# Patient Record
Sex: Male | Born: 1950
Health system: Southern US, Community
[De-identification: ages and names within clinical notes are randomized; demographics above are authoritative.]

## PROBLEM LIST (undated history)

## (undated) DIAGNOSIS — F988 Other specified behavioral and emotional disorders with onset usually occurring in childhood and adolescence: Secondary | ICD-10-CM

## (undated) DIAGNOSIS — Z91199 Patient's noncompliance with other medical treatment and regimen due to unspecified reason: Secondary | ICD-10-CM

## (undated) DIAGNOSIS — I10 Essential (primary) hypertension: Secondary | ICD-10-CM

## (undated) DIAGNOSIS — N529 Male erectile dysfunction, unspecified: Secondary | ICD-10-CM

## (undated) DIAGNOSIS — Z9119 Patient's noncompliance with other medical treatment and regimen: Secondary | ICD-10-CM

## (undated) DIAGNOSIS — IMO0002 Reserved for concepts with insufficient information to code with codable children: Secondary | ICD-10-CM

## (undated) DIAGNOSIS — E785 Hyperlipidemia, unspecified: Secondary | ICD-10-CM

## (undated) DIAGNOSIS — E1165 Type 2 diabetes mellitus with hyperglycemia: Secondary | ICD-10-CM

## (undated) DIAGNOSIS — B019 Varicella without complication: Secondary | ICD-10-CM

## (undated) HISTORY — DX: Other specified behavioral and emotional disorders with onset usually occurring in childhood and adolescence: F98.8

## (undated) HISTORY — DX: Male erectile dysfunction, unspecified: N52.9

## (undated) HISTORY — DX: Type 2 diabetes mellitus with hyperglycemia: E11.65

## (undated) HISTORY — DX: Patient's noncompliance with other medical treatment and regimen due to unspecified reason: Z91.199

## (undated) HISTORY — DX: Essential (primary) hypertension: I10

## (undated) HISTORY — DX: Hyperlipidemia, unspecified: E78.5

## (undated) HISTORY — DX: Reserved for concepts with insufficient information to code with codable children: IMO0002

## (undated) HISTORY — DX: Varicella without complication: B01.9

## (undated) HISTORY — PX: PENILE PROSTHESIS IMPLANT: SHX240

## (undated) HISTORY — PX: WISDOM TOOTH EXTRACTION: SHX21

## (undated) HISTORY — DX: Patient's noncompliance with other medical treatment and regimen: Z91.19

---

## 2002-05-30 ENCOUNTER — Encounter: Admission: RE | Admit: 2002-05-30 | Discharge: 2002-08-28 | Payer: Self-pay | Admitting: Family Medicine

## 2007-11-08 ENCOUNTER — Ambulatory Visit: Payer: Self-pay | Admitting: Internal Medicine

## 2007-11-08 DIAGNOSIS — E785 Hyperlipidemia, unspecified: Secondary | ICD-10-CM | POA: Insufficient documentation

## 2007-11-08 DIAGNOSIS — I10 Essential (primary) hypertension: Secondary | ICD-10-CM | POA: Insufficient documentation

## 2007-11-08 DIAGNOSIS — F528 Other sexual dysfunction not due to a substance or known physiological condition: Secondary | ICD-10-CM

## 2008-01-11 ENCOUNTER — Telehealth: Payer: Self-pay | Admitting: Internal Medicine

## 2008-01-11 ENCOUNTER — Ambulatory Visit: Payer: Self-pay | Admitting: Internal Medicine

## 2008-01-11 DIAGNOSIS — B351 Tinea unguium: Secondary | ICD-10-CM

## 2008-01-11 LAB — CONVERTED CEMR LAB
ALT: 20 units/L (ref 0–53)
AST: 16 units/L (ref 0–37)
Albumin: 4.3 g/dL (ref 3.5–5.2)
BUN: 17 mg/dL (ref 6–23)
Basophils Absolute: 0.1 10*3/uL (ref 0.0–0.1)
Basophils Relative: 1 % (ref 0–1)
Calcium: 9.7 mg/dL (ref 8.4–10.5)
Cholesterol: 274 mg/dL (ref 0–200)
Creatinine, Ser: 0.7 mg/dL (ref 0.4–1.5)
Creatinine,U: 79.7 mg/dL
Direct LDL: 117.9 mg/dL
Eosinophils Absolute: 0.3 10*3/uL (ref 0.0–0.7)
Eosinophils Relative: 5 % (ref 0–5)
GFR calc non Af Amer: 124 mL/min
HCT: 49.5 % (ref 39.0–52.0)
Hemoglobin: 16.3 g/dL (ref 13.0–17.0)
Hgb A1c MFr Bld: 9.5 % — ABNORMAL HIGH (ref 4.6–6.0)
Lymphocytes Relative: 28 % (ref 12–46)
MCHC: 32.9 g/dL (ref 30.0–36.0)
Microalb, Ur: 1.2 mg/dL (ref 0.0–1.9)
Monocytes Absolute: 0.3 10*3/uL (ref 0.1–1.0)
Platelets: 345 10*3/uL (ref 150–400)
RDW: 13.7 % (ref 11.5–15.5)
Total Bilirubin: 0.7 mg/dL (ref 0.3–1.2)
Total CHOL/HDL Ratio: 8.5
Triglycerides: 1042 mg/dL (ref 0–149)
VLDL: 208 mg/dL — ABNORMAL HIGH (ref 0–40)

## 2008-01-12 DIAGNOSIS — E119 Type 2 diabetes mellitus without complications: Secondary | ICD-10-CM | POA: Insufficient documentation

## 2008-01-12 DIAGNOSIS — E1165 Type 2 diabetes mellitus with hyperglycemia: Secondary | ICD-10-CM

## 2008-02-07 ENCOUNTER — Encounter: Payer: Self-pay | Admitting: Internal Medicine

## 2008-09-18 ENCOUNTER — Telehealth: Payer: Self-pay | Admitting: Internal Medicine

## 2008-09-18 ENCOUNTER — Ambulatory Visit: Payer: Self-pay | Admitting: Internal Medicine

## 2008-09-18 LAB — CONVERTED CEMR LAB
ALT: 19 units/L (ref 0–53)
AST: 13 units/L (ref 0–37)
BUN: 9 mg/dL (ref 6–23)
Calcium: 8.9 mg/dL (ref 8.4–10.5)
Creatinine, Ser: 0.7 mg/dL (ref 0.4–1.5)
Direct LDL: 159.9 mg/dL
GFR calc non Af Amer: 123.53 mL/min (ref >60)
HDL: 41.6 mg/dL (ref >39.00)
Hgb A1c MFr Bld: 13.6 % — ABNORMAL HIGH (ref 4.6–6.5)

## 2008-10-18 ENCOUNTER — Ambulatory Visit: Payer: Self-pay | Admitting: Internal Medicine

## 2008-10-18 LAB — CONVERTED CEMR LAB: Blood Glucose, Fingerstick: 285

## 2009-01-07 ENCOUNTER — Ambulatory Visit: Payer: Self-pay | Admitting: Internal Medicine

## 2009-01-08 ENCOUNTER — Encounter: Payer: Self-pay | Admitting: Internal Medicine

## 2009-01-18 LAB — CONVERTED CEMR LAB
CO2: 25 meq/L (ref 19–32)
Calcium: 9.4 mg/dL (ref 8.4–10.5)
Cholesterol: 217 mg/dL — ABNORMAL HIGH (ref 0–200)
Creatinine, Ser: 0.82 mg/dL (ref 0.40–1.50)
Creatinine, Urine: 141.3 mg/dL
HCV Ab: NEGATIVE
HDL: 54 mg/dL (ref >39)
Hepatitis B Surface Ag: NEGATIVE
Hgb A1c MFr Bld: 9.5 % — ABNORMAL HIGH (ref 4.6–6.1)
Microalb Creat Ratio: 19.2 mg/g (ref 0.0–30.0)
Microalb, Ur: 2.71 mg/dL — ABNORMAL HIGH (ref 0.00–1.89)
Sodium: 141 meq/L (ref 135–145)

## 2009-02-07 ENCOUNTER — Encounter: Payer: Self-pay | Admitting: Internal Medicine

## 2009-03-11 ENCOUNTER — Ambulatory Visit: Payer: Self-pay | Admitting: Internal Medicine

## 2009-03-11 LAB — CONVERTED CEMR LAB
Calcium: 9.9 mg/dL (ref 8.4–10.5)
Chloride: 105 meq/L (ref 96–112)
Creatinine, Ser: 0.8 mg/dL (ref 0.40–1.50)
Sodium: 142 meq/L (ref 135–145)

## 2009-03-12 ENCOUNTER — Encounter: Payer: Self-pay | Admitting: Internal Medicine

## 2009-06-05 ENCOUNTER — Ambulatory Visit: Payer: Self-pay | Admitting: Internal Medicine

## 2009-06-05 DIAGNOSIS — E11319 Type 2 diabetes mellitus with unspecified diabetic retinopathy without macular edema: Secondary | ICD-10-CM

## 2009-06-05 DIAGNOSIS — E113299 Type 2 diabetes mellitus with mild nonproliferative diabetic retinopathy without macular edema, unspecified eye: Secondary | ICD-10-CM | POA: Insufficient documentation

## 2009-06-05 LAB — CONVERTED CEMR LAB
Chloride: 103 meq/L (ref 96–112)
Glucose, Bld: 228 mg/dL — ABNORMAL HIGH (ref 70–99)
Hgb A1c MFr Bld: 8.4 % — ABNORMAL HIGH (ref 4.6–6.1)
Potassium: 4.4 meq/L (ref 3.5–5.3)
Sodium: 142 meq/L (ref 135–145)

## 2009-06-06 ENCOUNTER — Encounter: Payer: Self-pay | Admitting: Internal Medicine

## 2009-09-27 ENCOUNTER — Ambulatory Visit: Payer: Self-pay | Admitting: Internal Medicine

## 2009-09-27 DIAGNOSIS — F4320 Adjustment disorder, unspecified: Secondary | ICD-10-CM | POA: Insufficient documentation

## 2009-09-27 LAB — CONVERTED CEMR LAB
Calcium: 9.3 mg/dL (ref 8.4–10.5)
Hgb A1c MFr Bld: 9.7 % — ABNORMAL HIGH (ref <5.7)
Microalb Creat Ratio: 14.7 mg/g (ref 0.0–30.0)
Microalb, Ur: 1.26 mg/dL (ref 0.00–1.89)
Sodium: 139 meq/L (ref 135–145)

## 2009-09-29 ENCOUNTER — Encounter: Payer: Self-pay | Admitting: Internal Medicine

## 2009-11-07 ENCOUNTER — Telehealth: Payer: Self-pay | Admitting: Internal Medicine

## 2009-12-06 ENCOUNTER — Telehealth: Payer: Self-pay | Admitting: Internal Medicine

## 2009-12-23 ENCOUNTER — Ambulatory Visit: Payer: Self-pay | Admitting: Internal Medicine

## 2009-12-23 LAB — CONVERTED CEMR LAB
Chloride: 102 meq/L (ref 96–112)
Potassium: 4.5 meq/L (ref 3.5–5.3)

## 2009-12-24 ENCOUNTER — Telehealth: Payer: Self-pay | Admitting: Internal Medicine

## 2010-07-01 NOTE — Progress Notes (Signed)
Summary: Metformin Refill  Phone Note Refill Request Message from:  Fax from Pharmacy on November 07, 2009 9:17 AM  Refills Requested: Medication #1:  METFORMIN HCL 500 MG XR24H-TAB one by mouth bid   Dosage confirmed as above?Dosage Confirmed   Brand Name Necessary? No   Supply Requested: 1 month   Last Refilled: 08/29/2009  Method Requested: Electronic Next Appointment Scheduled: 12/23/09 @ 8:15 Dr Artist Pais Initial call taken by: Glendell Docker CMA,  November 07, 2009 9:17 AM    Prescriptions: METFORMIN HCL 500 MG XR24H-TAB (METFORMIN HCL) one by mouth bid  #60 x 5   Entered by:   Glendell Docker CMA   Authorized by:   D. Thomos Lemons DO   Signed by:   Glendell Docker CMA on 11/07/2009   Method used:   [facility] to        [facility] [phone]* (retail)       [address]       [place], [place]  [address]       Ph: [phone]       Fax: [phone]   RxID:   [id number]

## 2010-07-01 NOTE — Assessment & Plan Note (Signed)
Summary: 3 MONTH FOLLOW UP/MHF   Vital Signs:  Patient profile:   60 year old male Height:      63.9 inches Weight:      140.75 pounds BMI:     24.32 O2 Sat:      98 % on Room air Temp:     98.6 degrees F oral Pulse rate:   82 / minute Pulse rhythm:   regular Resp:     16 per minute BP sitting:   110 / 72  (right arm) Cuff size:   regular  Vitals Entered By: Glendell Docker CMA (December 23, 2009 8:09 AM)  O2 Flow:  Room air CC: Rm 2- 3 Month Follow up disease management Is Patient Diabetic? Yes Did you bring your meter with you today? No Pain Assessment Patient in pain? no       Does patient need assistance? Functional Status Self care Ambulation Normal Comments blood sugar avg 220, elevation is mainly at night   History of Present Illness:  Type 2 diabetes mellitus follow-up      This is a 60 year old man who presents with Type 2 diabetes mellitus follow-up.  The patient denies self managed hypoglycemia and hypoglycemia requiring help.  Since the last visit the patient reports poor dietary compliance and not monitoring blood glucose.   He reports eating less during the summer eating out less not using novolog consistently he is using novolg and lantus in AM   Preventive Screening-Counseling & Management  Alcohol-Tobacco     Smoking Status: current  Allergies (verified): No Known Drug Allergies  Past History:  Past Medical History: Diabetes mellitus, type II uncontrolled Nonproliferative diabetic retinopathy-Dr. Hazle Quant Hyperlipidemia Hypertension     Erectile dysfunction   ? ADD (evaluated by psych in the past) Non compliance   Family History: Family history of hyperlipidemia Father has bronchiectasis Denies family history of coronary artery disease or colon cancer         Social History: Smoking Status:  current  Physical Exam  General:  alert, well-developed, and well-nourished.   Lungs:  normal respiratory effort and normal breath sounds.     Heart:  normal rate, regular rhythm, and no gallop.   Extremities:  No lower extremity edema  Psych:  normally interactive and good eye contact.     Impression & Recommendations:  Problem # 1:  DIABETES MELLITUS, TYPE II, UNCONTROLLED (ICD-250.02) Diabetes control limited by compliance issues.  change lantus to two times a day.   endocrine consult may be helpful  His updated medication list for this problem includes:     Metformin Hcl 500 Mg Xr24h-tab (Metformin hcl) ..... One by mouth two times a day    Novolog Flexpen 100 Unit/ml Soln (Insulin aspart) .Marland KitchenMarland KitchenMarland KitchenMarland Kitchen 15-30 units before supper    Lisinopril 20 Mg Tabs (Lisinopril) ..... One by mouth once daily    Lantus Solostar 100 Unit/ml Soln (Insulin glargine) ..... Inject 30 units in am and 20 units at bedtime    Aspirin 81 Mg Tbec (Aspirin) ..... One by mouth once daily  Orders: T-Basic Metabolic Panel [id number]) T- Hemoglobin A1C (14782-95621)  Problem # 2:  HYPERTENSION (ICD-401.9) stable.  Maintain current medication regimen.  His updated medication list for this problem includes:    Lisinopril 20 Mg Tabs (Lisinopril) ..... One by mouth once daily  Orders: T-Basic Metabolic Panel [id number])  BP today: 110/72 Prior BP: 120/70 (09/27/2009)  Labs Reviewed: K+: 4.7 (09/27/2009) Creat: : 0.71 (09/27/2009)   Chol: 217 (  01/07/2009)   HDL: 54 (01/07/2009)   LDL: 127 (01/07/2009)   TG: 180 (01/07/2009)  Complete Medication List: 1)  Metformin Hcl 500 Mg Xr24h-tab (Metformin hcl) .... One by mouth bid 2)  Novolog Flexpen 100 Unit/ml Soln (Insulin aspart) .Marland KitchenMarland KitchenMarland Kitchen 15-30 units before supper 3)  Simvastatin 40 Mg Tabs (Simvastatin) .... One by mouth qpm 4)  Lisinopril 20 Mg Tabs (Lisinopril) .... One by mouth once daily 5)  Lantus Solostar 100 Unit/ml Soln (Insulin glargine) .... Inject 30 units in am and 20 units at bedtime 6)  Relion Pen Needle 31g X 8 Mm Misc (Insulin pen needle) .... Use qid as directed 7)  Aspirin 81 Mg  Tbec (Aspirin) .... One by mouth once daily  Patient Instructions: 1)  Please schedule a follow-up appointment in 1 month.  Prescriptions: LANTUS SOLOSTAR 100 UNIT/ML  SOLN (INSULIN GLARGINE) inject 30 units in AM and 20 units at bedtime  #1 month x 3   Entered and Authorized by:   D. Thomos Lemons DO   Signed by:   D. Thomos Lemons DO on 12/23/2009   Method used:   Electronically to        [facility] [phone]* (retail)       [address]       [place], [place]  [address]       Ph: [phone]       Fax: [phone]   RxID:   [id number]   Current Allergies (reviewed today): No known allergies

## 2010-07-01 NOTE — Progress Notes (Signed)
Summary: Lab Results  Phone Note Outgoing Call   Summary of Call: call pt - A1c is slightly worse.  10.2.  kidney function and electrolytes - normal.  If no improvement with b.i.d.  lantus, we can discuss endo referral at next OV Initial call taken by: D. Thomos Lemons DO,  December 24, 2009 1:49 PM  Follow-up for Phone Call        call placed to patient at [phone] he was informed per Dr Artist Pais instructions Follow-up by: Glendell Docker CMA,  December 24, 2009 1:57 PM

## 2010-07-01 NOTE — Assessment & Plan Note (Signed)
Summary: 3 month follow up/mhf   Vital Signs:  Patient profile:   60 year old male Weight:      148 pounds BMI:     25.90 O2 Sat:      100 % on Room air Temp:     98.1 degrees F oral Pulse rate:   84 / minute Pulse rhythm:   regular Resp:     16 per minute BP sitting:   142 / 80  (right arm) Cuff size:   large  Vitals Entered By: Glendell Docker CMA (June 05, 2009 8:13 AM)  O2 Flow:  Room air  Primary Care Provider:  D. Thomos Lemons DO  CC:  3 Month follow up and Type 2 diabetes mellitus follow-up.  History of Present Illness: 3 Month Follow up  Type 2 Diabetes Mellitus Follow-Up      This is a 60 year old man who presents for Type 2 diabetes mellitus follow-up.  The patient denies self managed hypoglycemia, hypoglycemia requiring help, and weight gain.  The patient denies the following symptoms: chest pain.  The patient has been measuring capillary blood glucose before breakfast.  Since the last visit, the patient reports having had eye care by an ophthalmologist.   low blood sugar 107 high blood 263 sugar avg 200's this am blood 205  Hypertension-patient has good compliance. No dizziness. No chest pain.  Diabetic retinopathy -seen by Dr. Hazle Quant in September.  he has followup appointment in 6 months.  Stressful home situation. finalizing divorce.  financial issues  Preventive Screening-Counseling & Management  Alcohol-Tobacco     Smoking Status: never  Allergies (verified): No Known Drug Allergies  Past History:  Past Medical History: Diabetes mellitus, type II uncontrolled Nonproliferative diabetic retinopathy-Dr. Hazle Quant Hyperlipidemia Hypertension   Erectile dysfunction   ? ADD (evaluated by psych in the past) Non compliance   Social History: Occupation: Former Education officer, environmental,  Retail banker  Separated- in the process of divorce. 3 daughters ages 4, 58, 29 Never Smoked  Alcohol use-no     Smoking Status:  never  Physical Exam  General:  alert,  well-developed, and well-nourished.   Neck:  supple and no masses.  no carotid bruits.   Lungs:  normal respiratory effort and normal breath sounds.   Heart:  normal rate, regular rhythm, and no gallop.   Extremities:  No lower extremity edema Psych:  normally interactive and good eye contact.    Diabetes Management Exam:       Nails:          Left foot: too long          Right foot: too long   Impression & Recommendations:  Problem # 1:  DIABETES MELLITUS, TYPE II, UNCONTROLLED (ICD-250.02) Assessment Improved CBG in AM better 130's to 200's.  no hypoglycemia.  he has not been using novolog before dinner.  Pt urged to use before supper.  Never at bedtime  His updated medication list for this problem includes:    Metformin Hcl 500 Mg Xr24h-tab (Metformin hcl) ..... One by mouth bid    Novolog Flexpen 100 Unit/ml Soln (Insulin aspart) .Marland KitchenMarland KitchenMarland KitchenMarland Kitchen 10 - 15 units before supper    Lisinopril 20 Mg Tabs (Lisinopril) ..... One by mouth once daily    Lantus Solostar 100 Unit/ml Soln (Insulin glargine) ..... Inject 40-50 units subcutaneously  every morning    Aspirin 81 Mg Tbec (Aspirin) ..... One by mouth once daily  Orders: T- Hemoglobin A1C ([id number])  Problem #  2:  HYPERTENSION (ICD-401.9) He reports good compliance.   inc lisinopril to 20mg   His updated medication list for this problem includes:    Lisinopril 20 Mg Tabs (Lisinopril) ..... One by mouth once daily  Orders: T-Basic Metabolic Panel [id number])  BP today: 142/80 Prior BP: 124/70 (03/11/2009)  Labs Reviewed: K+: 4.4 (03/11/2009) Creat: : 0.80 (03/11/2009)   Chol: 217 (01/07/2009)   HDL: 54 (01/07/2009)   LDL: 127 (01/07/2009)   TG: 180 (01/07/2009)  Problem # 3:  HYPERLIPIDEMIA (ICD-272.4) change simvastatin to pravastatin  His updated medication list for this problem includes:    Simvastatin 40 Mg Tabs (Simvastatin) ..... One by mouth qpm  Labs Reviewed: SGOT: 13 (09/18/2008)   SGPT: 19 (09/18/2008)    HDL:54 (01/07/2009), 41.60 (09/18/2008)  LDL:127 (01/07/2009), DEL (01/11/2008)  Chol:217 (01/07/2009), 263 (09/18/2008)  Trig:180 (01/07/2009), 359.0 (09/18/2008)  Problem # 4:  DIABETIC RETINOPATHY, BACKGROUND (ICD-362.01) he has followup appointment with ophthalmologist. We stressed the importance of adequate blood sugar control to avoid worsening diabetic retinopathy  Complete Medication List: 1)  Metformin Hcl 500 Mg Xr24h-tab (Metformin hcl) .... One by mouth bid 2)  Novolog Flexpen 100 Unit/ml Soln (Insulin aspart) .Marland Kitchen.. 10 - 15 units before supper 3)  Simvastatin 40 Mg Tabs (Simvastatin) .... One by mouth qpm 4)  Lisinopril 20 Mg Tabs (Lisinopril) .... One by mouth once daily 5)  Lantus Solostar 100 Unit/ml Soln (Insulin glargine) .... Inject 40-50 units subcutaneously  every morning 6)  Relion Pen Needle 31g X 8 Mm Misc (Insulin pen needle) .... Use qid as directed 7)  Aspirin 81 Mg Tbec (Aspirin) .... One by mouth once daily  Patient Instructions: 1)  Please schedule a follow-up appointment in 3 months. 2)  BMP prior to visit, ICD-9:  401.9 3)  Hepatic Panel prior to visit, ICD-9:  272.4 4)  Lipid Panel prior to visit, ICD-9:  272.4 5)  HbgA1C prior to visit, ICD-9:  250.02 6)  Urine Microalbumin prior to visit, ICD-9: 250.02 7)  Please return for lab work one (1) week before your next appointment.  Prescriptions: SIMVASTATIN 40 MG TABS (SIMVASTATIN) one by mouth qpm  #30 x 5   Entered and Authorized by:   D. Thomos Lemons DO   Signed by:   D. Thomos Lemons DO on 06/05/2009   Method used:   Electronically to        [facility] [phone]* (retail)       [address]       [place], [place]  [address]       Ph: [phone]       Fax: [phone]   RxID:   [id number] LISINOPRIL 20 MG TABS (LISINOPRIL) one by mouth once daily  #30 x 5   Entered and Authorized by:   D. Thomos Lemons DO   Signed by:   D. Thomos Lemons DO on 06/05/2009   Method used:    Electronically to        [facility] [phone]* (retail)       [address]       [place], [place]  [address]       Ph: [phone]       Fax: [phone]   RxID:   [id number]   Current Allergies (reviewed today): No known allergies    Immunization History:  Influenza Immunization History:    Influenza:  historical (04/16/2009)

## 2010-07-01 NOTE — Progress Notes (Signed)
Summary: Lisinopril Refill  Phone Note Refill Request Message from:  Fax from Pharmacy on December 06, 2009 9:20 AM  Refills Requested: Medication #1:  LISINOPRIL 20 MG TABS one by mouth once daily   Dosage confirmed as above?Dosage Confirmed   Brand Name Necessary? No   Supply Requested: 1 month   Last Refilled: 10/29/2009  Method Requested: Telephone to Pharmacy Next Appointment Scheduled: 12/23/2009 @ 8:15am Dr Artist Pais Initial call taken by: Glendell Docker CMA,  December 06, 2009 9:20 AM    Prescriptions: LISINOPRIL 20 MG TABS (LISINOPRIL) one by mouth once daily  #30 x 4   Entered by:   Glendell Docker CMA   Authorized by:   D. Thomos Lemons DO   Signed by:   Glendell Docker CMA on 12/06/2009   Method used:   Telephoned to ...       [facility] [phone]* (retail)       [address]       [place], [place]  [address]       Ph: [phone]       Fax: [phone]   RxID:   [id number]

## 2010-07-01 NOTE — Letter (Signed)
   [name] [facility] [address] [place], [place]  [address]  [place] Phone: [phone]      Sep 29, 2009   Philip Holt [address] [place], [place] [address]  RE:  LAB RESULTS  Dear  [name],  The following is an interpretation of your most recent lab tests.  Please take note of any instructions provided or changes to medications that have resulted from your lab work.  ELECTROLYTES:  Good - no changes needed  KIDNEY FUNCTION TESTS:  Good - no changes needed   DIABETIC STUDIES:  Poor - schedule a follow-up appointment soon Blood Glucose: 263   HgbA1C: 9.7   Microalbumin/Creatinine Ratio: 14.7       Please use your Novalog (short acting insulin) before evening meal.     Sincerely Yours,    Dr. Thomos Lemons

## 2010-07-01 NOTE — Assessment & Plan Note (Signed)
Summary: 3 MONTH FOLLOW UP/MHF, resched- jr [facility] FROM [facility]/MHF   Vital Signs:  Patient profile:   60 year old male Height:      63.5 inches Weight:      143.25 pounds BMI:     25.07 O2 Sat:      99 % on Room air Temp:     98.3 degrees F oral Pulse rate:   93 / minute BP sitting:   120 / 70  (left arm) Cuff size:   regular  Vitals Entered By: Lucious Groves (September 27, 2009 8:16 AM)  O2 Flow:  Room air CC: 3 mo f/u--Pt states that he is doing about the same./kb, Type 2 diabetes mellitus follow-up Is Patient Diabetic? Yes Did you bring your meter with you today? No Pain Assessment Patient in pain? no        Primary Care Provider:  Dondra Spry DO  CC:  3 mo f/u--Pt states that he is doing about the same./kb and Type 2 diabetes mellitus follow-up.  History of Present Illness:  Type 2 Diabetes Mellitus Follow-Up      This is a 60 year old man who presents for Type 2 diabetes mellitus follow-up.  The patient denies self managed hypoglycemia and hypoglycemia requiring help.  The patient denies the following symptoms: chest pain.  Since the last visit the patient reports monitoring blood glucose.    main meal is evening meal  still going through difficult divorce stressed with legal matters, dealing with lawyers   Current Medications (verified): 1)  Metformin Hcl 500 Mg Xr24h-Tab (Metformin Hcl) .... One By Mouth Bid 2)  Novolog Flexpen 100 Unit/ml  Soln (Insulin Aspart) .Marland Kitchen.. 10 - 15 Units Before Supper 3)  Simvastatin 40 Mg Tabs (Simvastatin) .... One By Mouth Qpm 4)  Lisinopril 20 Mg Tabs (Lisinopril) .... One By Mouth Once Daily 5)  Lantus Solostar 100 Unit/ml  Soln (Insulin Glargine) .... Inject 40-50 Units Subcutaneously  Every Morning 6)  Relion Pen Needle 31g X 8 Mm Misc (Insulin Pen Needle) .... Use Qid As Directed 7)  Aspirin 81 Mg Tbec (Aspirin) .... One By Mouth Once Daily  Allergies (verified): No Known Drug Allergies  Past History:  Past Medical  History: Diabetes mellitus, type II uncontrolled Nonproliferative diabetic retinopathy-Dr. Hazle Quant Hyperlipidemia Hypertension    Erectile dysfunction   ? ADD (evaluated by psych in the past) Non compliance   Family History: Family history of hyperlipidemia Father has bronchiectasis Denies family history of coronary artery disease or colon cancer       Social History: Occupation: Former Education officer, environmental,  Retail banker  Separated- in the process of divorce. 3 daughters ages 58, 43, 5 Never Smoked  Alcohol use-no        Physical Exam  General:  alert, well-developed, and well-nourished.   Lungs:  normal respiratory effort and normal breath sounds.   Heart:  normal rate, regular rhythm, and no gallop.   Extremities:  No lower extremity edema   Diabetes Management Exam:    Foot Exam (with socks and/or shoes not present):       Inspection:          Left foot: normal          Right foot: normal   Impression & Recommendations:  Problem # 1:  DIABETES MELLITUS, TYPE II, UNCONTROLLED (ICD-250.02) Assessment Unchanged AM CBGs in the 200-300.  His main meal is supper with significant carb intake.  Decrease lantus dose and increase Novolog dose  before supper.  His updated medication list for this problem includes:    Metformin Hcl 500 Mg Xr24h-tab (Metformin hcl) ..... One by mouth bid    Novolog Flexpen 100 Unit/ml Soln (Insulin aspart) .Marland KitchenMarland KitchenMarland KitchenMarland Kitchen 15-30 units before supper    Lisinopril 20 Mg Tabs (Lisinopril) ..... One by mouth once daily    Lantus Solostar 100 Unit/ml Soln (Insulin glargine) ..... Inject 30-40 units subcutaneously  every morning    Aspirin 81 Mg Tbec (Aspirin) ..... One by mouth once daily  Orders: T-Basic Metabolic Panel [id number]) T- Hemoglobin A1C ([id number]) T-Urine Microalbumin w/creat. ratio [id number])  Problem # 2:  HYPERTENSION (ICD-401.9) Assessment: Improved well controlled.  Maintain current medication regimen.  His updated  medication list for this problem includes:    Lisinopril 20 Mg Tabs (Lisinopril) ..... One by mouth once daily  BP today: 120/70 Prior BP: 142/80 (06/05/2009)  Labs Reviewed: K+: 4.4 (06/05/2009) Creat: : 0.70 (06/05/2009)   Chol: 217 (01/07/2009)   HDL: 54 (01/07/2009)   LDL: 127 (01/07/2009)   TG: 180 (01/07/2009)  Problem # 3:  ADJUSTMENT DISORDER (ICD-309.9) pt going through stressful divorse.  he declines SSRI  Complete Medication List: 1)  Metformin Hcl 500 Mg Xr24h-tab (Metformin hcl) .... One by mouth bid 2)  Novolog Flexpen 100 Unit/ml Soln (Insulin aspart) .Marland KitchenMarland KitchenMarland Kitchen 15-30 units before supper 3)  Simvastatin 40 Mg Tabs (Simvastatin) .... One by mouth qpm 4)  Lisinopril 20 Mg Tabs (Lisinopril) .... One by mouth once daily 5)  Lantus Solostar 100 Unit/ml Soln (Insulin glargine) .... Inject 30-40 units subcutaneously  every morning 6)  Relion Pen Needle 31g X 8 Mm Misc (Insulin pen needle) .... Use qid as directed 7)  Aspirin 81 Mg Tbec (Aspirin) .... One by mouth once daily  Patient Instructions: 1)  Please schedule a follow-up appointment in 3 months. Prescriptions: NOVOLOG FLEXPEN 100 UNIT/ML  SOLN (INSULIN ASPART) 15-30 units before supper  #1 month x 3   Entered and Authorized by:   D. Thomos Lemons DO   Signed by:   D. Thomos Lemons DO on 09/27/2009   Method used:   Electronically to        [facility] [phone]* (retail)       [address]       [place], [place]  [address]       Ph: [phone]       Fax: [phone]   RxID:   [id number]

## 2010-07-01 NOTE — Letter (Signed)
   [place] [facility] [address] [place], [place]  [address]  [place] Phone: [phone]      June 06, 2009   [facility] [address] [address] [place], [place] [address]  RE:  LAB RESULTS  Dear  Mr. Dilmore,  The following is an interpretation of your most recent lab tests.  Please take note of any instructions provided or changes to medications that have resulted from your lab work.  ELECTROLYTES:  Good - no changes needed  KIDNEY FUNCTION TESTS:  Good - no changes needed   DIABETIC STUDIES:  Fair - schedule a follow-up appointment Blood Glucose: 228   HgbA1C: 8.4   Microalbumin/Creatinine Ratio: 19.2       I will further discuss your lab results at your next follow up appointment.      Sincerely Yours,    Dr. Thomos Lemons

## 2010-09-02 ENCOUNTER — Other Ambulatory Visit: Payer: Self-pay | Admitting: *Deleted

## 2010-09-02 DIAGNOSIS — I10 Essential (primary) hypertension: Secondary | ICD-10-CM

## 2010-09-02 DIAGNOSIS — E785 Hyperlipidemia, unspecified: Secondary | ICD-10-CM

## 2010-09-02 NOTE — Telephone Encounter (Signed)
Patient was in office today to accompany his father to office visit and stated he lost his insurance. He requested generic rxs for his blood pressure and cholesterol.

## 2010-09-03 MED ORDER — SIMVASTATIN 40 MG PO TABS: 40.0000 mg | ORAL_TABLET | Freq: Every evening | ORAL | Status: DC

## 2010-09-03 MED ORDER — LISINOPRIL 20 MG PO TABS: 20.0000 mg | ORAL_TABLET | Freq: Every day | ORAL | Status: DC

## 2010-09-03 NOTE — Telephone Encounter (Signed)
rx's sent to [facility]

## 2010-10-09 ENCOUNTER — Ambulatory Visit: Payer: Self-pay | Admitting: Internal Medicine

## 2011-07-30 ENCOUNTER — Encounter: Payer: Self-pay | Admitting: Internal Medicine

## 2011-07-31 ENCOUNTER — Ambulatory Visit (INDEPENDENT_AMBULATORY_CARE_PROVIDER_SITE_OTHER): Payer: Self-pay | Admitting: Internal Medicine

## 2011-07-31 ENCOUNTER — Encounter: Payer: Self-pay | Admitting: Internal Medicine

## 2011-07-31 VITALS — BP 114/80 | HR 89 | Temp 98.1°F | Resp 18 | Ht 63.5 in | Wt 146.0 lb

## 2011-07-31 DIAGNOSIS — E11319 Type 2 diabetes mellitus with unspecified diabetic retinopathy without macular edema: Secondary | ICD-10-CM

## 2011-07-31 DIAGNOSIS — H579 Unspecified disorder of eye and adnexa: Secondary | ICD-10-CM

## 2011-07-31 DIAGNOSIS — E1139 Type 2 diabetes mellitus with other diabetic ophthalmic complication: Secondary | ICD-10-CM

## 2011-07-31 DIAGNOSIS — E785 Hyperlipidemia, unspecified: Secondary | ICD-10-CM

## 2011-07-31 DIAGNOSIS — I10 Essential (primary) hypertension: Secondary | ICD-10-CM

## 2011-07-31 MED ORDER — LISINOPRIL 10 MG PO TABS: 10.0000 mg | ORAL_TABLET | Freq: Every day | ORAL | Status: DC

## 2011-07-31 MED ORDER — PRAVASTATIN SODIUM 40 MG PO TABS: 40.0000 mg | ORAL_TABLET | Freq: Every day | ORAL | Status: DC

## 2011-07-31 MED ORDER — INSULIN GLARGINE 100 UNIT/ML ~~LOC~~ SOLN: 20.0000 [IU] | Freq: Every day | SUBCUTANEOUS | Status: DC

## 2011-07-31 MED ORDER — METFORMIN HCL 500 MG PO TABS: 500.0000 mg | ORAL_TABLET | Freq: Two times a day (BID) | ORAL | Status: DC

## 2011-07-31 MED ORDER — INSULIN PEN NEEDLE 31G X 8 MM MISC: Status: AC

## 2011-07-31 NOTE — Assessment & Plan Note (Signed)
Begin pravastatin 40mg  po qd.

## 2011-07-31 NOTE — Assessment & Plan Note (Signed)
Poor control. Complicated by noncompliance. Given samples of lantus (20units qhs), novolog 8 qac. rf metformin 500mg  bid. Instructed to check fsbs bid and record for review. Return to clinic in ~3wks for re-evaluation.

## 2011-07-31 NOTE — Progress Notes (Signed)
  Subjective:    Patient ID: Philip Holt, male    DOB: May 03, 1951, 61 y.o.   MRN: [id number]  HPI Pt presents to clinic for followup of multiple medical problems. Has not followed up since 2011 and is taking lantus, novolog (dinner only), and metformin intermittently. Brings labs from a free clinic 1/13 with chem7 glu 283, nl cr, a1c 12.8, vit d 20, tchol 271, tg 279, hdl 46, ldl 169, ast and t4 nl. Taking zocor and lisinopril intermittently as well. H/o diabetic retinopathy with no recent f/u with optho. States has several glucometers at home but is not checking fsbs. No complaints.  Past Medical History  Diagnosis Date  . Diabetes mellitus type II, uncontrolled   . Diabetic retinopathy     Dr Robby Sermon proliferative  . Hyperlipidemia   . Hypertension   . Erectile dysfunction   . ADD (attention deficit disorder)     evaluated by psych in the past  . Non-compliance with treatment    Past Surgical History  Procedure Date  . No past surgeries     reports that he has been smoking.  He has never used smokeless tobacco. He reports that he does not drink alcohol or use illicit drugs. family history includes Hyperlipidemia in an unspecified family member and Other in his father.  There is no history of Heart disease and Colon cancer. No Known Allergies    Review of Systems see hpi     Objective:   Physical Exam  Physical Exam  Nursing note and vitals reviewed. Constitutional: Appears well-developed and well-nourished. No distress.  HENT:  Head: Normocephalic and atraumatic.  Right Ear: External ear normal.  Left Ear: External ear normal.  Eyes: Conjunctivae are normal. No scleral icterus.  Neck: Neck supple. Carotid bruit is not present.  Cardiovascular: Normal rate, regular rhythm and normal heart sounds.  Exam reveals no gallop and no friction rub.   No murmur heard. Pulmonary/Chest: Effort normal and breath sounds normal. No respiratory distress. He has no wheezes. no rales.    Lymphadenopathy:    He has no cervical adenopathy.  Neurological:Alert.  Skin: Skin is warm and dry. Not diaphoretic.  Psychiatric: Has a normal mood and affect.        Assessment & Plan:

## 2011-07-31 NOTE — Assessment & Plan Note (Signed)
Refer to optho for f/u.

## 2011-08-17 ENCOUNTER — Telehealth: Payer: Self-pay | Admitting: Internal Medicine

## 2011-08-17 NOTE — Telephone Encounter (Signed)
Derek from [facility], Dr. Alan Mulder office, called stating that patient canceled his appointment for tomorrow due to not having any insurance and that he could not pay for appointment out of pocket.

## 2011-08-18 ENCOUNTER — Encounter (INDEPENDENT_AMBULATORY_CARE_PROVIDER_SITE_OTHER): Payer: Self-pay | Admitting: Ophthalmology

## 2011-08-21 ENCOUNTER — Ambulatory Visit: Payer: Self-pay | Admitting: Internal Medicine

## 2013-06-08 ENCOUNTER — Ambulatory Visit (INDEPENDENT_AMBULATORY_CARE_PROVIDER_SITE_OTHER): Payer: BC Managed Care – PPO | Admitting: Physician Assistant

## 2013-06-08 ENCOUNTER — Encounter: Payer: Self-pay | Admitting: Physician Assistant

## 2013-06-08 VITALS — BP 160/92 | HR 103 | Temp 98.2°F | Resp 14 | Ht 63.5 in | Wt 130.2 lb

## 2013-06-08 DIAGNOSIS — E11319 Type 2 diabetes mellitus with unspecified diabetic retinopathy without macular edema: Secondary | ICD-10-CM

## 2013-06-08 DIAGNOSIS — E785 Hyperlipidemia, unspecified: Secondary | ICD-10-CM

## 2013-06-08 DIAGNOSIS — K219 Gastro-esophageal reflux disease without esophagitis: Secondary | ICD-10-CM

## 2013-06-08 DIAGNOSIS — E1165 Type 2 diabetes mellitus with hyperglycemia: Secondary | ICD-10-CM

## 2013-06-08 DIAGNOSIS — I10 Essential (primary) hypertension: Secondary | ICD-10-CM

## 2013-06-08 DIAGNOSIS — Z79899 Other long term (current) drug therapy: Secondary | ICD-10-CM

## 2013-06-08 DIAGNOSIS — B351 Tinea unguium: Secondary | ICD-10-CM

## 2013-06-08 DIAGNOSIS — Z0000 Encounter for general adult medical examination without abnormal findings: Secondary | ICD-10-CM

## 2013-06-08 DIAGNOSIS — IMO0001 Reserved for inherently not codable concepts without codable children: Secondary | ICD-10-CM

## 2013-06-08 DIAGNOSIS — F528 Other sexual dysfunction not due to a substance or known physiological condition: Secondary | ICD-10-CM

## 2013-06-08 DIAGNOSIS — Z23 Encounter for immunization: Secondary | ICD-10-CM

## 2013-06-08 LAB — HEPATIC FUNCTION PANEL
ALT: 17 U/L (ref 0–53)
AST: 16 U/L (ref 0–37)
Albumin: 4.6 g/dL (ref 3.5–5.2)
Alkaline Phosphatase: 105 U/L (ref 39–117)
BILIRUBIN DIRECT: 0.1 mg/dL (ref 0.0–0.3)
BILIRUBIN INDIRECT: 0.3 mg/dL (ref 0.0–0.9)
TOTAL PROTEIN: 6.9 g/dL (ref 6.0–8.3)
Total Bilirubin: 0.4 mg/dL (ref 0.3–1.2)

## 2013-06-08 LAB — BASIC METABOLIC PANEL
BUN: 9 mg/dL (ref 6–23)
CHLORIDE: 96 meq/L (ref 96–112)
CO2: 24 meq/L (ref 19–32)
CREATININE: 0.64 mg/dL (ref 0.50–1.35)
Calcium: 9.3 mg/dL (ref 8.4–10.5)
GLUCOSE: 335 mg/dL — AB (ref 70–99)
Potassium: 4.4 mEq/L (ref 3.5–5.3)
Sodium: 134 mEq/L — ABNORMAL LOW (ref 135–145)

## 2013-06-08 LAB — PSA: PSA: 2.33 ng/mL (ref <=4.00)

## 2013-06-08 LAB — CBC WITH DIFFERENTIAL/PLATELET
BASOS PCT: 2 % — AB (ref 0–1)
Basophils Absolute: 0.1 10*3/uL (ref 0.0–0.1)
EOS PCT: 4 % (ref 0–5)
Eosinophils Absolute: 0.3 10*3/uL (ref 0.0–0.7)
HEMATOCRIT: 46 % (ref 39.0–52.0)
Hemoglobin: 16.1 g/dL (ref 13.0–17.0)
Lymphocytes Relative: 27 % (ref 12–46)
Lymphs Abs: 1.6 10*3/uL (ref 0.7–4.0)
MCH: 30.5 pg (ref 26.0–34.0)
MCHC: 35 g/dL (ref 30.0–36.0)
MCV: 87.1 fL (ref 78.0–100.0)
MONO ABS: 0.3 10*3/uL (ref 0.1–1.0)
Monocytes Relative: 5 % (ref 3–12)
Neutro Abs: 3.7 10*3/uL (ref 1.7–7.7)
Neutrophils Relative %: 62 % (ref 43–77)
Platelets: 281 10*3/uL (ref 150–400)
RBC: 5.28 MIL/uL (ref 4.22–5.81)
RDW: 12.9 % (ref 11.5–15.5)
WBC: 6 10*3/uL (ref 4.0–10.5)

## 2013-06-08 LAB — HEMOGLOBIN A1C
Hgb A1c MFr Bld: 14.8 % — ABNORMAL HIGH (ref <5.7)
Mean Plasma Glucose: 378 mg/dL — ABNORMAL HIGH (ref <117)

## 2013-06-08 LAB — LIPID PANEL
CHOL/HDL RATIO: 9.1 ratio
CHOLESTEROL: 381 mg/dL — AB (ref 0–200)
HDL: 42 mg/dL (ref >39)
Triglycerides: 1884 mg/dL — ABNORMAL HIGH (ref <150)

## 2013-06-08 LAB — POCT CBG (FASTING - GLUCOSE)-MANUAL ENTRY: Glucose Fasting, POC: 352 mg/dL — AB (ref 70–99)

## 2013-06-08 MED ORDER — METFORMIN HCL 500 MG PO TABS: 500.0000 mg | ORAL_TABLET | Freq: Two times a day (BID) | ORAL | Status: DC

## 2013-06-08 MED ORDER — PRAVASTATIN SODIUM 40 MG PO TABS: 40.0000 mg | ORAL_TABLET | Freq: Every day | ORAL | Status: DC

## 2013-06-08 MED ORDER — OMEPRAZOLE 20 MG PO CPDR: 20.0000 mg | DELAYED_RELEASE_CAPSULE | Freq: Every day | ORAL | Status: DC

## 2013-06-08 MED ORDER — INSULIN GLARGINE 100 UNIT/ML SOLOSTAR PEN: PEN_INJECTOR | SUBCUTANEOUS | Status: DC

## 2013-06-08 MED ORDER — LISINOPRIL 10 MG PO TABS: 10.0000 mg | ORAL_TABLET | Freq: Every day | ORAL | Status: DC

## 2013-06-08 NOTE — Progress Notes (Signed)
Pre visit review using our clinic review tool, if applicable. No additional management support is needed unless otherwise documented below in the visit note/SLS  

## 2013-06-08 NOTE — Progress Notes (Signed)
Patient ID: Philip Holt, male   DOB: 01/04/1951, 63 y.o.   MRN: [id number]  Patient presents to clinic today to transfer care from Dr. Rodena MedinHodgin and for annual exam. Patient has multiple medical issues and has not been seen in 2 years. Patient is fasting for labs.  Acute Concerns: Patient requesting medication refills.   Patient complains of acid reflux. States symptoms occur several times a week. Denies epigastric pain. Denies nausea or vomiting. Denies globus. Does endorse halitosis.  Chronic Issues: Diabetes Mellitus II -- patient with history of uncontrolled type 2 diabetes, requiring form and insulin therapy. Patient has not been seen in clinic for 2 years. Has been out of medication 2 years. Patient has history of diabetic retinopathy. Patient was also on lisinopril for renal protection. Has not had that prescription filled in 2 years. Patient does endorse occasional blurry vision. Denies numbness or tingling of lower extremities. Denie lesion of feet.   Diabetic Retinopathy -- patient has not seen ophthalmology in several years. Will make referral. Does endorse some blurry vision. Denies double vision, eye pain or drainage.   Hyperlipidemia --patient has history of hyperlipidemia requiring pravastatin. Patient has been out of medication for 2 years. Patient is fasting for labs.   Erectile Dysfunction -- patient has history of erectile dysfunction, most likely stemming from uncontrolled diabetes and hyperlipidemia.  Has not been on medication for this issue.   Hypertension -- patient with history of high blood pressure. Patient was prescribed lisinopril 10 mg for blood pressure. Has not had medication in 2 years. BP elevated at 160/92 in clinic. Patient denies chest pain, palpitations, lightheadedness, dizziness, headache. Does endorse occasional blurry vision.   Health Maintenance: Dental -- Overdue Vision -- Overdue.  Has history of diabetic retinopathy.  Needs referral to  ophthalmology. Immunizations -- Declines flu.  Needs pneumonia vaccination.  Declines tetanus today.   Colonoscopy -- Overdue.  Due for a repeat colonoscopy.  Past Medical History  Diagnosis Date  . Diabetes mellitus type II, uncontrolled   . Diabetic retinopathy     Dr Robby Sermonigby-non proliferative  . Hyperlipidemia   . Hypertension   . Erectile dysfunction   . ADD (attention deficit disorder)     evaluated by psych in the past  . Non-compliance with treatment   . Chicken pox     Past Surgical History  Procedure Laterality Date  . Wisdom tooth extraction      Current Outpatient Prescriptions on File Prior to Visit  Medication Sig Dispense Refill  . aspirin 81 MG tablet Take 81 mg by mouth daily.      . insulin aspart (NOVOLOG FLEXPEN) 100 UNIT/ML injection Inject 15-30 Units into the skin daily before supper.      . insulin glargine (LANTUS SOLOSTAR) 100 UNIT/ML injection Inject 20 Units into the skin at bedtime. Inject subcutaneous 30 units in the am and 20 units at bedtime  10 mL  0  . lisinopril (PRINIVIL,ZESTRIL) 10 MG tablet Take 1 tablet (10 mg total) by mouth daily.  30 tablet  6  . metFORMIN (GLUCOPHAGE) 500 MG tablet Take 1 tablet (500 mg total) by mouth 2 (two) times daily with a meal.  60 tablet  6  . pravastatin (PRAVACHOL) 40 MG tablet Take 1 tablet (40 mg total) by mouth daily.  30 tablet  6   No current facility-administered medications on file prior to visit.    No Known Allergies  Family History  Problem Relation Age of Onset  . Hyperlipidemia  Mother   . Other Father     Bronchiectasis  . Heart disease Neg Hx   . Colon cancer Neg Hx   . Asthma Mother   . Healthy Daughter     x3    History   Social History  . Marital Status: Married    Spouse Name: N/A    Number of Children: N/A  . Years of Education: N/A   Occupational History  . Not on file.   Social History Main Topics  . Smoking status: Former Smoker    Quit date: 04/01/2013  . Smokeless  tobacco: Never Used  . Alcohol Use: No  . Drug Use: No  . Sexual Activity: Not on file   Other Topics Concern  . Not on file   Social History Narrative   Occupation: [employer],  Retail banker      Married      3 daughters ages 30, 54, 27      Never Smoked      Alcohol use-no   Review of Systems  Constitutional: Negative for fever and weight loss.  HENT: Negative for ear discharge, ear pain, hearing loss and tinnitus.   Eyes: Positive for blurred vision. Negative for double vision, photophobia and pain.  Respiratory: Negative for cough and shortness of breath.   Cardiovascular: Negative for chest pain and palpitations.  Gastrointestinal: Positive for heartburn. Negative for nausea, vomiting, abdominal pain, diarrhea, constipation, blood in stool and melena.  Genitourinary: Negative for dysuria, urgency, frequency, hematuria and flank pain.       Nocturia x 1  Musculoskeletal: Positive for joint pain.  Neurological: Negative for dizziness, seizures, loss of consciousness and headaches.  Endo/Heme/Allergies: Negative for environmental allergies.  Psychiatric/Behavioral: Negative for depression, suicidal ideas, hallucinations and substance abuse. The patient is not nervous/anxious and does not have insomnia.    BP 160/92  Pulse 103  Temp(Src) 98.2 F (36.8 C) (Oral)  Resp 14  Ht 5' 3.5" (1.613 m)  Wt 130 lb 4 oz (59.081 kg)  BMI 22.71 kg/m2  SpO2 98%  Physical Exam  Vitals reviewed. Constitutional: He is oriented to person, place, and time and well-developed, well-nourished, and in no distress.  HENT:  Head: Normocephalic and atraumatic.  Right Ear: External ear normal.  Left Ear: External ear normal.  Nose: Nose normal.  Mouth/Throat: Oropharynx is clear and moist. No oropharyngeal exudate.  Tympanic membranes within normal limits bilaterally.  Eyes: Conjunctivae and EOM are normal. Pupils are equal, round, and reactive to light. Right eye exhibits no discharge. Left  eye exhibits no discharge. No scleral icterus.  Neck: Normal range of motion. Neck supple. No thyromegaly present.  Cardiovascular: Normal rate, regular rhythm, normal heart sounds and intact distal pulses.   Pulmonary/Chest: Effort normal and breath sounds normal. No respiratory distress. He has no wheezes. He has no rales. He exhibits no tenderness.  Abdominal: Soft. Bowel sounds are normal. He exhibits no distension and no mass. There is no tenderness. There is no rebound and no guarding.  Lymphadenopathy:    He has no cervical adenopathy.  Neurological: He is alert and oriented to person, place, and time. No cranial nerve deficit.  Skin: Skin is warm and dry. No rash noted.  Psychiatric: Affect normal.   Assessment/Plan: No problem-specific assessment & plan notes found for this encounter.

## 2013-06-08 NOTE — Patient Instructions (Signed)
Please take medications as prescribed.  Check your blood sugar before breakfast, at midday, and before bedtime.  Please write down and bring to visit in 2 weeks.  Wear compressions stockings while working to help reduce swelling. You will be contacted by GI for a colonoscopy.  You will be contacted by Podiatry for foot care.  Please return to clinic in 2 weeks.  Hypertension As your heart beats, it forces blood through your arteries. This force is your blood pressure. If the pressure is too high, it is called hypertension (HTN) or high blood pressure. HTN is dangerous because you may have it and not know it. High blood pressure may mean that your heart has to work harder to pump blood. Your arteries may be narrow or stiff. The extra work puts you at risk for heart disease, stroke, and other problems.  Blood pressure consists of two numbers, a higher number over a lower, 110/72, for example. It is stated as "110 over 72." The ideal is below 120 for the top number (systolic) and under 80 for the bottom (diastolic). Write down your blood pressure today. You should pay close attention to your blood pressure if you have certain conditions such as:  Heart failure.  Prior heart attack.  Diabetes  Chronic kidney disease.  Prior stroke.  Multiple risk factors for heart disease. To see if you have HTN, your blood pressure should be measured while you are seated with your arm held at the level of the heart. It should be measured at least twice. A one-time elevated blood pressure reading (especially in the Emergency Department) does not mean that you need treatment. There may be conditions in which the blood pressure is different between your right and left arms. It is important to see your caregiver soon for a recheck. Most people have essential hypertension which means that there is not a specific cause. This type of high blood pressure may be lowered by changing lifestyle factors such  as:  Stress.  Smoking.  Lack of exercise.  Excessive weight.  Drug/tobacco/alcohol use.  Eating less salt. Most people do not have symptoms from high blood pressure until it has caused damage to the body. Effective treatment can often prevent, delay or reduce that damage. TREATMENT  When a cause has been identified, treatment for high blood pressure is directed at the cause. There are a large number of medications to treat HTN. These fall into several categories, and your caregiver will help you select the medicines that are best for you. Medications may have side effects. You should review side effects with your caregiver. If your blood pressure stays high after you have made lifestyle changes or started on medicines,   Your medication(s) may need to be changed.  Other problems may need to be addressed.  Be certain you understand your prescriptions, and know how and when to take your medicine.  Be sure to follow up with your caregiver within the time frame advised (usually within two weeks) to have your blood pressure rechecked and to review your medications.  If you are taking more than one medicine to lower your blood pressure, make sure you know how and at what times they should be taken. Taking two medicines at the same time can result in blood pressure that is too low. SEEK IMMEDIATE MEDICAL CARE IF:  You develop a severe headache, blurred or changing vision, or confusion.  You have unusual weakness or numbness, or a faint feeling.  You have severe chest  or abdominal pain, vomiting, or breathing problems. MAKE SURE YOU:   Understand these instructions.  Will watch your condition.  Will get help right away if you are not doing well or get worse. Document Released: 05/18/2005 Document Revised: 08/10/2011 Document Reviewed: 01/06/2008 [facility] Patient Information 2014 [place].  Diabetes and Standards of Medical Care  Diabetes is complicated. You may find that  your diabetes team includes a dietitian, nurse, diabetes educator, eye doctor, and more. To help everyone know what is going on and to help you get the care you deserve, the following schedule of care was developed to help keep you on track. Below are the tests, exams, vaccines, medicines, education, and plans you will need. HbA1c test This test shows how well you have controlled your glucose over the past 2 3 months. It is used to see if your diabetes management plan needs to be adjusted.   It is performed at least 2 times a year if you are meeting treatment goals.  It is performed 4 times a year if therapy has changed or if you are not meeting treatment goals. Blood pressure test  This test is performed at every routine medical visit. The goal is less than 140/90 mmHg for most people, but 130/80 mmHg in some cases. Ask your health care provider about your goal. Dental exam  Follow up with the dentist regularly. Eye exam  If you are diagnosed with type 1 diabetes as a child, get an exam upon reaching the age of 65 years or older and have had diabetes for 3 5 years. Yearly eye exams are recommended after that initial eye exam.  If you are diagnosed with type 1 diabetes as an adult, get an exam within 5 years of diagnosis and then yearly.  If you are diagnosed with type 2 diabetes, get an exam as soon as possible after the diagnosis and then yearly. Foot care exam  Visual foot exams are performed at every routine medical visit. The exams check for cuts, injuries, or other problems with the feet.  A comprehensive foot exam should be done yearly. This includes visual inspection as well as assessing foot pulses and testing for loss of sensation.  Check your feet nightly for cuts, injuries, or other problems with your feet. Tell your health care provider if anything is not healing. Kidney function test (urine microalbumin)  This test is performed once a year.  Type 1 diabetes: The first  test is performed 5 years after diagnosis.  Type 2 diabetes: The first test is performed at the time of diagnosis.  A serum creatinine and estimated glomerular filtration rate (eGFR) test is done once a year to assess the level of chronic kidney disease (CKD), if present. Lipid profile (cholesterol, HDL, LDL, triglycerides)  Performed every 5 years for most people.  The goal for LDL is less than 100 mg/dL. If you are at high risk, the goal is less than 70 mg/dL.  The goal for HDL is 40 mg/dL 50 mg/dL for men and 50 mg/dL 60 mg/dL for women. An HDL cholesterol of 60 mg/dL or higher gives some protection against heart disease.  The goal for triglycerides is less than 150 mg/dL. Influenza vaccine, pneumococcal vaccine, and hepatitis B vaccine  The influenza vaccine is recommended yearly.  The pneumococcal vaccine is generally given once in a lifetime. However, there are some instances when another vaccination is recommended. Check with your health care provider.  The hepatitis B vaccine is also recommended for  adults with diabetes. Diabetes self-management education  Education is recommended at diagnosis and ongoing as needed. Treatment plan  Your treatment plan is reviewed at every medical visit. Document Released: 03/15/2009 Document Revised: 01/18/2013 Document Reviewed: 10/18/2012 [facility] Patient Information 2014 [place].

## 2013-06-09 ENCOUNTER — Telehealth: Payer: Self-pay | Admitting: Physician Assistant

## 2013-06-09 ENCOUNTER — Telehealth: Payer: Self-pay

## 2013-06-09 LAB — URINALYSIS, ROUTINE W REFLEX MICROSCOPIC
Bilirubin Urine: NEGATIVE
HGB URINE DIPSTICK: NEGATIVE
Ketones, ur: >80 mg/dL — AB
LEUKOCYTES UA: NEGATIVE
NITRITE: NEGATIVE
PH: 6 (ref 5.0–8.0)
Protein, ur: NEGATIVE mg/dL
Urobilinogen, UA: 0.2 mg/dL (ref 0.0–1.0)

## 2013-06-09 LAB — TSH: TSH: 1.362 u[IU]/mL (ref 0.350–4.500)

## 2013-06-09 LAB — MICROALBUMIN / CREATININE URINE RATIO
Creatinine, Urine: 26.8 mg/dL
MICROALB/CREAT RATIO: 356.7 mg/g — AB (ref 0.0–30.0)
Microalb, Ur: 9.56 mg/dL — ABNORMAL HIGH (ref 0.00–1.89)

## 2013-06-09 LAB — URINALYSIS, MICROSCOPIC ONLY
BACTERIA UA: NONE SEEN
CRYSTALS: NONE SEEN
Casts: NONE SEEN
SQUAMOUS EPITHELIAL / LPF: NONE SEEN

## 2013-06-09 NOTE — Telephone Encounter (Signed)
Relevant patient education mailed to patient.  

## 2013-06-11 DIAGNOSIS — Z0000 Encounter for general adult medical examination without abnormal findings: Secondary | ICD-10-CM | POA: Insufficient documentation

## 2013-06-11 DIAGNOSIS — K219 Gastro-esophageal reflux disease without esophagitis: Secondary | ICD-10-CM | POA: Insufficient documentation

## 2013-06-11 NOTE — Assessment & Plan Note (Signed)
Will obtain BMP, A1c, urinalysis and urine microalbumin. Restart insulin as prescribed. Monitor blood sugars. Write down and bring to follow up in 2 weeks. Diabetic foot exam within normal limits.

## 2013-06-11 NOTE — Assessment & Plan Note (Addendum)
Severe. Referral to podiatry for diabetic nail care.  Discussed options for treatment of onychomycosis. Patient wishes to defer  to podiatry.

## 2013-06-11 NOTE — Assessment & Plan Note (Signed)
Rx'd Prilosec 20 mg. Avoid trigger foods. Avoid late-night eating. Elevate head of bed. Monitor weight. Return to clinic if symptoms not improving.

## 2013-06-11 NOTE — Assessment & Plan Note (Addendum)
Health maintenance reviewed and updated. Will obtain fasting labs. Patient declines flu shot. Pneumonia vaccine given. Referral to GI for colonoscopy.

## 2013-06-11 NOTE — Assessment & Plan Note (Signed)
Referral to ophthalmology 

## 2013-06-11 NOTE — Assessment & Plan Note (Signed)
Refill lisinopril 10 mg. Patient will most likely need a second antihypertensive agent. Patient to return in 1 week for BP recheck.

## 2013-06-11 NOTE — Assessment & Plan Note (Signed)
Refill Pravachol. Will obtain fasting lipids

## 2013-06-12 ENCOUNTER — Telehealth: Payer: Self-pay | Admitting: Physician Assistant

## 2013-06-12 DIAGNOSIS — E119 Type 2 diabetes mellitus without complications: Secondary | ICD-10-CM

## 2013-06-12 MED ORDER — INSULIN ASPART 100 UNIT/ML ~~LOC~~ SOLN: 15.0000 [IU] | Freq: Every day | SUBCUTANEOUS | Status: DC

## 2013-06-12 NOTE — Telephone Encounter (Signed)
Lab results are in.  His A1C is 14.8 which is extremely elevated.  He had been told to restart his Lantus at previous dose.  I am also restarting his Novolog at previous dose.  Rx will be sent to his pharmacy.  Remind patient to write down his blood sugar recordings to bring to his appointment on 1/22.  Also urine testing revealed his diabetes is affecting his kidneys.  Make sure he is taking his Lisinopril as directed.  His cholesterol was too significantly elevated to be accurately measures, most likely due to high Triglyerides (result of uncontrolled diabetes).  He should be taking his Pravachol as prescribed.  He needs to come to his appointment on 1/22 fasting for labs so we can re-attempt to get an accurate cholesterol measurement.

## 2013-06-14 NOTE — Telephone Encounter (Signed)
LMOM with contact name and number for return call RE: results and further provider instructions/SLS  

## 2013-06-19 NOTE — Telephone Encounter (Signed)
LMOM [2nd] with contact name and number for return call RE: results and further provider instructions; also, reminded pt to keep scheduled f/u appt for 06/22/2013 8:15 AM/SLS

## 2013-06-22 ENCOUNTER — Encounter: Payer: Self-pay | Admitting: Physician Assistant

## 2013-06-22 ENCOUNTER — Ambulatory Visit (INDEPENDENT_AMBULATORY_CARE_PROVIDER_SITE_OTHER): Payer: BC Managed Care – PPO | Admitting: Physician Assistant

## 2013-06-22 VITALS — BP 126/80 | HR 88 | Temp 98.0°F | Resp 16 | Wt 138.2 lb

## 2013-06-22 DIAGNOSIS — E1165 Type 2 diabetes mellitus with hyperglycemia: Secondary | ICD-10-CM

## 2013-06-22 DIAGNOSIS — IMO0001 Reserved for inherently not codable concepts without codable children: Secondary | ICD-10-CM

## 2013-06-22 DIAGNOSIS — E785 Hyperlipidemia, unspecified: Secondary | ICD-10-CM

## 2013-06-22 DIAGNOSIS — I10 Essential (primary) hypertension: Secondary | ICD-10-CM

## 2013-06-22 LAB — LIPID PANEL
Cholesterol: 197 mg/dL (ref 0–200)
HDL: 68 mg/dL (ref >39)
LDL Cholesterol: 112 mg/dL — ABNORMAL HIGH (ref 0–99)
Total CHOL/HDL Ratio: 2.9 Ratio
Triglycerides: 84 mg/dL (ref <150)
VLDL: 17 mg/dL (ref 0–40)

## 2013-06-22 LAB — LDL CHOLESTEROL, DIRECT: Direct LDL: 122 mg/dL — ABNORMAL HIGH

## 2013-06-22 NOTE — Patient Instructions (Signed)
Continue medications as prescribed.  Please make sure to eat more lean protein.  Do not skip meals.    Check blood sugar 3 x day.  Write down your results.  I will call you with your lab results.  Your goal morning blood sugar is 100-130.  Please follow-up in 3-4 weeks.

## 2013-06-23 ENCOUNTER — Encounter: Payer: Self-pay | Admitting: Physician Assistant

## 2013-06-23 NOTE — Progress Notes (Signed)
Patient presents to clinic today for two-week followup of diabetes. Patient was seen last to establish care. Has diagnosis of type 2 diabetes mellitus that is uncontrolled. Had prior prescription for metformin, Lantus and NovoLog. Patient had been out of medications for him as to year. We restarted on patient's medications. Patient states he has taken his medicines some of the time. States he forgets to take his medicines with him at work. Has been checking his blood sugar every morning before breakfast. Reports blood sugars in the 200-300 range. Denies lightheadedness, dizziness or agitation. Patient has not scheduled appointments a specialist referred him to yet.    At last visit, we obtained a fasting lipid profile. Triglycerides were 2 high for the LDL to be correctly counted. Now that patient has been restarted on his pravastatin and insulin, will need to recheck his LDL. Past Medical History  Diagnosis Date  . Diabetes mellitus type II, uncontrolled   . Diabetic retinopathy     Dr Cyndi Bender proliferative  . Hyperlipidemia   . Hypertension   . Erectile dysfunction   . ADD (attention deficit disorder)     evaluated by psych in the past  . Non-compliance with treatment   . Chicken pox     Current Outpatient Prescriptions on File Prior to Visit  Medication Sig Dispense Refill  . aspirin 81 MG tablet Take 81 mg by mouth daily.      . insulin aspart (NOVOLOG FLEXPEN) 100 UNIT/ML injection Inject 15-30 Units into the skin daily before supper.  10 mL  3  . Insulin Glargine (LANTUS SOLOSTAR) 100 UNIT/ML Solostar Pen Inject 20 units each am.  Inject 30 units at bedtime.  5 pen  PRN  . lisinopril (PRINIVIL,ZESTRIL) 10 MG tablet Take 1 tablet (10 mg total) by mouth daily.  30 tablet  1  . metFORMIN (GLUCOPHAGE) 500 MG tablet Take 1 tablet (500 mg total) by mouth 2 (two) times daily with a meal.  60 tablet  2  . omeprazole (PRILOSEC) 20 MG capsule Take 1 capsule (20 mg total) by mouth daily.  30  capsule  3  . pravastatin (PRAVACHOL) 40 MG tablet Take 1 tablet (40 mg total) by mouth daily.  30 tablet  2   No current facility-administered medications on file prior to visit.    No Known Allergies  Family History  Problem Relation Age of Onset  . Hyperlipidemia Mother   . Other Father     Bronchiectasis  . Heart disease Neg Hx   . Colon cancer Neg Hx   . Asthma Mother   . Healthy Daughter     x3    History   Social History  . Marital Status: Married    Spouse Name: N/A    Number of Children: N/A  . Years of Education: N/A   Social History Main Topics  . Smoking status: Former Smoker    Quit date: 04/01/2013  . Smokeless tobacco: Never Used  . Alcohol Use: Yes     Comment: rare  . Drug Use: No  . Sexual Activity: None   Other Topics Concern  . None   Social History Narrative   Occupation: Theme park manager,  Technical sales engineer      Married      3 daughters ages 39, 67, 70      Never Smoked      Alcohol use-no    Review of Systems - See HPI.  All other ROS are negative.  Filed Vitals:  06/22/13 0849  BP: 126/80  Pulse: 88  Temp: 98 F (36.7 C)  Resp: 16    Physical Exam  Vitals reviewed. Constitutional: He is oriented to person, place, and time and well-developed, well-nourished, and in no distress.  HENT:  Head: Normocephalic and atraumatic.  Eyes: Conjunctivae are normal. Pupils are equal, round, and reactive to light.  Neck: Neck supple.  Cardiovascular: Normal rate, regular rhythm and normal heart sounds.   Pulmonary/Chest: Effort normal and breath sounds normal.  Neurological: He is alert and oriented to person, place, and time.  Skin: Skin is warm and dry. No rash noted.  Psychiatric: Affect normal.    Recent Results (from the past 2160 hour(s))  POCT CBG (FASTING - GLUCOSE)-MANUAL ENTRY     Status: Abnormal   Collection Time    06/08/13  9:04 AM      Result Value Range   Glucose Fasting, POC 352 (*) 70 - 99 mg/dL   Comment: Fasting   CBC WITH DIFFERENTIAL     Status: Abnormal   Collection Time    06/08/13 10:12 AM      Result Value Range   WBC 6.0  4.0 - 10.5 K/uL   RBC 5.28  4.22 - 5.81 MIL/uL   Hemoglobin 16.1  13.0 - 17.0 g/dL   HCT 46.0  39.0 - 52.0 %   MCV 87.1  78.0 - 100.0 fL   MCH 30.5  26.0 - 34.0 pg   MCHC 35.0  30.0 - 36.0 g/dL   RDW 12.9  11.5 - 15.5 %   Platelets 281  150 - 400 K/uL   Neutrophils Relative % 62  43 - 77 %   Neutro Abs 3.7  1.7 - 7.7 K/uL   Lymphocytes Relative 27  12 - 46 %   Lymphs Abs 1.6  0.7 - 4.0 K/uL   Monocytes Relative 5  3 - 12 %   Monocytes Absolute 0.3  0.1 - 1.0 K/uL   Eosinophils Relative 4  0 - 5 %   Eosinophils Absolute 0.3  0.0 - 0.7 K/uL   Basophils Relative 2 (*) 0 - 1 %   Basophils Absolute 0.1  0.0 - 0.1 K/uL   Smear Review Criteria for review not met    URINALYSIS, ROUTINE W REFLEX MICROSCOPIC     Status: Abnormal   Collection Time    06/08/13 10:12 AM      Result Value Range   Color, Urine YELLOW  YELLOW   APPearance CLEAR  CLEAR   Specific Gravity, Urine >1.030 (*) 1.005 - 1.030   pH 6.0  5.0 - 8.0   Glucose, UA > 1000 (*) NEG mg/dL   Bilirubin Urine NEG  NEG   Ketones, ur > 80 (*) NEG mg/dL   Hgb urine dipstick NEG  NEG   Protein, ur NEG  NEG mg/dL   Urobilinogen, UA 0.2  0.0 - 1.0 mg/dL   Nitrite NEG  NEG   Leukocytes, UA NEG  NEG  LIPID PANEL     Status: Abnormal   Collection Time    06/08/13 10:12 AM      Result Value Range   Cholesterol 381 (*) 0 - 200 mg/dL   Comment: ATP III Classification:           < 200        mg/dL        Desirable          200 - 239  mg/dL        Borderline High          >= 240        mg/dL        High         Triglycerides 1884 (*) <150 mg/dL   Comment: Result repeated and verified.     Result confirmed by automatic dilution.   HDL 42  >39 mg/dL   Total CHOL/HDL Ratio 9.1     VLDL NOT CALC  0 - 40 mg/dL   Comment:       Not calculated due to Triglyceride >400.     Suggest ordering Direct LDL (Unit Code:  [phone]).   LDL Cholesterol NOT CALC  0 - 99 mg/dL   Comment:       Not calculated due to Triglyceride >400.     Suggest ordering Direct LDL (Unit Code: [phone]).           Total Cholesterol/HDL Ratio:CHD Risk                            Coronary Heart Disease Risk Table                                            Men       Women              1/2 Average Risk              3.4        3.3                  Average Risk              5.0        4.4               2X Average Risk              9.6        7.1               3X Average Risk             23.4       11.0     Use the calculated Patient Ratio above and the CHD Risk table      to determine the patient's CHD Risk.     ATP III Classification (LDL):           < 100        mg/dL         Optimal          100 - 129     mg/dL         Near or Above Optimal          130 - 159     mg/dL         Borderline High          160 - 189     mg/dL         High           > 190        mg/dL         Very High        TSH     Status: None   Collection Time  06/08/13 10:12 AM      Result Value Range   TSH 1.362  0.350 - 4.500 uIU/mL  BASIC METABOLIC PANEL     Status: Abnormal   Collection Time    06/08/13 10:12 AM      Result Value Range   Sodium 134 (*) 135 - 145 mEq/L   Potassium 4.4  3.5 - 5.3 mEq/L   Chloride 96  96 - 112 mEq/L   CO2 24  19 - 32 mEq/L   Glucose, Bld 335 (*) 70 - 99 mg/dL   BUN 9  6 - 23 mg/dL   Creat 0.64  0.50 - 1.35 mg/dL   Calcium 9.3  8.4 - 10.5 mg/dL  PSA     Status: None   Collection Time    06/08/13 10:12 AM      Result Value Range   PSA 2.33  <=4.00 ng/mL   Comment: Test Methodology: ECLIA PSA (Electrochemiluminescence Immunoassay)           For PSA values from 2.5-4.0, particularly in younger men <60 years     old, the AUA and NCCN suggest testing for % Free PSA (3515) and     evaluation of the rate of increase in PSA (PSA velocity).  HEPATIC FUNCTION PANEL     Status: None   Collection Time    06/08/13 10:12 AM       Result Value Range   Total Bilirubin 0.4  0.3 - 1.2 mg/dL   Bilirubin, Direct 0.1  0.0 - 0.3 mg/dL   Indirect Bilirubin 0.3  0.0 - 0.9 mg/dL   Alkaline Phosphatase 105  39 - 117 U/L   AST 16  0 - 37 U/L   ALT 17  0 - 53 U/L   Total Protein 6.9  6.0 - 8.3 g/dL   Albumin 4.6  3.5 - 5.2 g/dL  HEMOGLOBIN A1C     Status: Abnormal   Collection Time    06/08/13 10:12 AM      Result Value Range   Hemoglobin A1C 14.8 (*) <5.7 %   Comment:                                                                            According to the ADA Clinical Practice Recommendations for 2011, when     HbA1c is used as a screening test:             >=6.5%   Diagnostic of Diabetes Mellitus                (if abnormal result is confirmed)           5.7-6.4%   Increased risk of developing Diabetes Mellitus           References:Diagnosis and Classification of Diabetes Mellitus,Diabetes     ZYSA,6301,60(FUXNA 1):S62-S69 and Standards of Medical Care in             Diabetes - 2011,Diabetes Care,2011,34 (Suppl 1):S11-S61.         Mean Plasma Glucose 378 (*) <117 mg/dL  MICROALBUMIN / CREATININE URINE RATIO     Status: Abnormal   Collection Time    06/08/13 10:12 AM  Result Value Range   Microalb, Ur 9.56 (*) 0.00 - 1.89 mg/dL   Creatinine, Urine 26.8     Microalb Creat Ratio 356.7 (*) 0.0 - 30.0 mg/g  URINALYSIS, MICROSCOPIC ONLY     Status: None   Collection Time    06/08/13 10:12 AM      Result Value Range   Squamous Epithelial / LPF NONE SEEN  RARE   Crystals NONE SEEN  NONE SEEN   Casts NONE SEEN  NONE SEEN   WBC, UA 0-2  <3 WBC/hpf   RBC / HPF 0-2  <3 RBC/hpf   Bacteria, UA NONE SEEN  RARE  LIPID PANEL     Status: Abnormal   Collection Time    06/22/13  9:48 AM      Result Value Range   Cholesterol 197  0 - 200 mg/dL   Comment: ATP III Classification:           < 200        mg/dL        Desirable          200 - 239     mg/dL        Borderline High          >= 240        mg/dL         High         Triglycerides 84  <150 mg/dL   HDL 68  >39 mg/dL   Total CHOL/HDL Ratio 2.9     VLDL 17  0 - 40 mg/dL   LDL Cholesterol 112 (*) 0 - 99 mg/dL   Comment:       Total Cholesterol/HDL Ratio:CHD Risk                            Coronary Heart Disease Risk Table                                            Men       Women              1/2 Average Risk              3.4        3.3                  Average Risk              5.0        4.4               2X Average Risk              9.6        7.1               3X Average Risk             23.4       11.0     Use the calculated Patient Ratio above and the CHD Risk table      to determine the patient's CHD Risk.     ATP III Classification (LDL):           < 100        mg/dL         Optimal  100 - 129     mg/dL         Near or Above Optimal          130 - 159     mg/dL         Borderline High          160 - 189     mg/dL         High           > 190        mg/dL         Very High        LDL CHOLESTEROL, DIRECT     Status: Abnormal   Collection Time    06/22/13  9:48 AM      Result Value Range   Direct LDL 122 (*)    Comment: ATP III Classification (LDL):           < 100        mg/dL         Optimal          100 - 129     mg/dL         Near or Above Optimal          130 - 159     mg/dL         Borderline High          160 - 189     mg/dL         High           > 190        mg/dL         Very High          Assessment/Plan: No problem-specific assessment & plan notes found for this encounter.

## 2013-06-25 NOTE — Assessment & Plan Note (Signed)
BP normotensive while taking lisinopril.  Continue current regimen.  Will recheck at next visit.

## 2013-06-25 NOTE — Assessment & Plan Note (Signed)
A1C 14.8, stemming from long-term uncontrolled diabetes mellitus.  Reinforced need for patient to take medication as prescribed each day.  Discusses importance of healthy meals and not skipping meals.  Patient to records blood sugar measurements.  Return in 3-4 weeks with blood sugar log.

## 2013-06-25 NOTE — Assessment & Plan Note (Signed)
Recheck LDL now that patient has resumed medications.

## 2013-07-19 ENCOUNTER — Ambulatory Visit: Payer: Self-pay | Admitting: Podiatry

## 2013-07-20 ENCOUNTER — Ambulatory Visit: Payer: BC Managed Care – PPO | Admitting: Physician Assistant

## 2013-08-07 ENCOUNTER — Ambulatory Visit: Payer: Self-pay | Admitting: Podiatry

## 2013-08-24 ENCOUNTER — Encounter: Payer: Self-pay | Admitting: Physician Assistant

## 2013-08-24 ENCOUNTER — Telehealth: Payer: Self-pay | Admitting: *Deleted

## 2013-08-24 ENCOUNTER — Ambulatory Visit (INDEPENDENT_AMBULATORY_CARE_PROVIDER_SITE_OTHER): Payer: BC Managed Care – PPO | Admitting: Physician Assistant

## 2013-08-24 VITALS — BP 144/88 | HR 100 | Temp 97.8°F | Resp 16 | Wt 135.0 lb

## 2013-08-24 DIAGNOSIS — I10 Essential (primary) hypertension: Secondary | ICD-10-CM

## 2013-08-24 DIAGNOSIS — E1165 Type 2 diabetes mellitus with hyperglycemia: Principal | ICD-10-CM

## 2013-08-24 DIAGNOSIS — E119 Type 2 diabetes mellitus without complications: Secondary | ICD-10-CM

## 2013-08-24 DIAGNOSIS — IMO0001 Reserved for inherently not codable concepts without codable children: Secondary | ICD-10-CM

## 2013-08-24 MED ORDER — INSULIN DETEMIR 100 UNIT/ML FLEXPEN: PEN_INJECTOR | SUBCUTANEOUS | Status: DC

## 2013-08-24 MED ORDER — LISINOPRIL 10 MG PO TABS
10.0000 mg | ORAL_TABLET | Freq: Every day | ORAL | Status: DC
Start: 2013-08-24 — End: 2013-11-12

## 2013-08-24 MED ORDER — LISINOPRIL 10 MG PO TABS: 10.0000 mg | ORAL_TABLET | Freq: Every day | ORAL | Status: DC

## 2013-08-24 MED ORDER — METFORMIN HCL 500 MG PO TABS: 500.0000 mg | ORAL_TABLET | Freq: Two times a day (BID) | ORAL | Status: DC

## 2013-08-24 MED ORDER — INSULIN ASPART 100 UNIT/ML ~~LOC~~ SOLN: 15.0000 [IU] | Freq: Every day | SUBCUTANEOUS | Status: DC

## 2013-08-24 NOTE — Progress Notes (Signed)
Pre-visit discussion using our clinic review tool. No additional management support is needed unless otherwise documented below in the visit note.  

## 2013-08-24 NOTE — Telephone Encounter (Signed)
Patient identifiable Left message stating [facility]citions [facility] [place] [place]. I advised patient to call back with questions/concerns. JG//CMA  Called in Levemir with same dosing instructions as Lantus (20 units in am, 30 at bedtime). Should be cheaper that the Lantus according to his insurance plan. Continue monitoring blood sugars. Write down in a journal to bring to follow-up in 1 month. Will check A1C at that time.

## 2013-08-24 NOTE — Progress Notes (Signed)
Patient presents to clinic today for follow-up of uncontrolled Diabetes Mellitus II.  Patient has history of noncompliance.  Patient's last A1C 2 months ago was 14.8.  Medications were restarted. Patient was instructed to follow-up in 1 month and to bring blood sugar journal to visit.  Patient endorses taking Metformin as directed.  Has continued taking 15 units of Novolog at bedtime. Has been out of Lantus and did not get his refill.  States he knows he should be better about taking his medication.  Patient has already been instructed on the importance of taking medications as prescribed.  Patient already suffers from Diabetic Retinopathy and Erectile Dysfunction.  Has not seen Ophthalmology as directed.  Has seen podiatry.  Has been out of Lisinopril for ~ 1 month.  Have previously discussed seeing an Endocrinologist but patient does not wish to do so.   Past Medical History  Diagnosis Date  . Diabetes mellitus type II, uncontrolled   . Diabetic retinopathy     Dr Cyndi Bender proliferative  . Hyperlipidemia   . Hypertension   . Erectile dysfunction   . ADD (attention deficit disorder)     evaluated by psych in the past  . Non-compliance with treatment   . Chicken pox    Current Outpatient Prescriptions on File Prior to Visit  Medication Sig Dispense Refill  . aspirin 81 MG tablet Take 81 mg by mouth daily.      Marland Kitchen omeprazole (PRILOSEC) 20 MG capsule Take 1 capsule (20 mg total) by mouth daily.  30 capsule  3  . pravastatin (PRAVACHOL) 40 MG tablet Take 1 tablet (40 mg total) by mouth daily.  30 tablet  2   No current facility-administered medications on file prior to visit.    No Known Allergies  Family History  Problem Relation Age of Onset  . Hyperlipidemia Mother   . Other Father     Bronchiectasis  . Heart disease Neg Hx   . Colon cancer Neg Hx   . Asthma Mother   . Healthy Daughter     x3    History   Social History  . Marital Status: Married    Spouse Name: N/A     Number of Children: N/A  . Years of Education: N/A   Social History Main Topics  . Smoking status: Former Smoker    Quit date: 04/01/2013  . Smokeless tobacco: Never Used  . Alcohol Use: Yes     Comment: rare  . Drug Use: No  . Sexual Activity: None   Other Topics Concern  . None   Social History Narrative   Occupation: Theme park manager,  Technical sales engineer      Married      3 daughters ages 89, 20, 23      Never Smoked      Alcohol use-no   Review of Systems - See HPI.  All other ROS are negative.  BP 144/88  Pulse 100  Temp(Src) 97.8 F (36.6 C) (Oral)  Resp 16  Wt 135 lb (61.236 kg)  SpO2 98%  Physical Exam  Vitals reviewed. Constitutional: He is oriented to person, place, and time and well-developed, well-nourished, and in no distress.  HENT:  Head: Normocephalic and atraumatic.  Right Ear: External ear normal.  Left Ear: External ear normal.  Nose: Nose normal.  Mouth/Throat: Oropharynx is clear and moist. No oropharyngeal exudate.  Eyes: Conjunctivae are normal. Pupils are equal, round, and reactive to light.  Neck: Neck supple.  Cardiovascular: Normal rate,  regular rhythm, normal heart sounds and intact distal pulses.   Pulmonary/Chest: Effort normal and breath sounds normal. No respiratory distress. He has no wheezes. He has no rales. He exhibits no tenderness.  Neurological: He is alert and oriented to person, place, and time.  Skin: Skin is warm and dry. No rash noted.  Psychiatric: Affect normal.    Recent Results (from the past 2160 hour(s))  POCT CBG (FASTING - GLUCOSE)-MANUAL ENTRY     Status: Abnormal   Collection Time    06/08/13  9:04 AM      Result Value Ref Range   Glucose Fasting, POC 352 (*) 70 - 99 mg/dL   Comment: Fasting  CBC WITH DIFFERENTIAL     Status: Abnormal   Collection Time    06/08/13 10:12 AM      Result Value Ref Range   WBC 6.0  4.0 - 10.5 K/uL   RBC 5.28  4.22 - 5.81 MIL/uL   Hemoglobin 16.1  13.0 - 17.0 g/dL   HCT 46.0   39.0 - 52.0 %   MCV 87.1  78.0 - 100.0 fL   MCH 30.5  26.0 - 34.0 pg   MCHC 35.0  30.0 - 36.0 g/dL   RDW 12.9  11.5 - 15.5 %   Platelets 281  150 - 400 K/uL   Neutrophils Relative % 62  43 - 77 %   Neutro Abs 3.7  1.7 - 7.7 K/uL   Lymphocytes Relative 27  12 - 46 %   Lymphs Abs 1.6  0.7 - 4.0 K/uL   Monocytes Relative 5  3 - 12 %   Monocytes Absolute 0.3  0.1 - 1.0 K/uL   Eosinophils Relative 4  0 - 5 %   Eosinophils Absolute 0.3  0.0 - 0.7 K/uL   Basophils Relative 2 (*) 0 - 1 %   Basophils Absolute 0.1  0.0 - 0.1 K/uL   Smear Review Criteria for review not met    URINALYSIS, ROUTINE W REFLEX MICROSCOPIC     Status: Abnormal   Collection Time    06/08/13 10:12 AM      Result Value Ref Range   Color, Urine YELLOW  YELLOW   APPearance CLEAR  CLEAR   Specific Gravity, Urine >1.030 (*) 1.005 - 1.030   pH 6.0  5.0 - 8.0   Glucose, UA > 1000 (*) NEG mg/dL   Bilirubin Urine NEG  NEG   Ketones, ur > 80 (*) NEG mg/dL   Hgb urine dipstick NEG  NEG   Protein, ur NEG  NEG mg/dL   Urobilinogen, UA 0.2  0.0 - 1.0 mg/dL   Nitrite NEG  NEG   Leukocytes, UA NEG  NEG  LIPID PANEL     Status: Abnormal   Collection Time    06/08/13 10:12 AM      Result Value Ref Range   Cholesterol 381 (*) 0 - 200 mg/dL   Comment: ATP III Classification:           < 200        mg/dL        Desirable          200 - 239     mg/dL        Borderline High          >= 240        mg/dL        High         Triglycerides 1884 (*) <  150 mg/dL   Comment: Result repeated and verified.     Result confirmed by automatic dilution.   HDL 42  >39 mg/dL   Total CHOL/HDL Ratio 9.1     VLDL NOT CALC  0 - 40 mg/dL   Comment:       Not calculated due to Triglyceride >400.     Suggest ordering Direct LDL (Unit Code: [phone]).   LDL Cholesterol NOT CALC  0 - 99 mg/dL   Comment:       Not calculated due to Triglyceride >400.     Suggest ordering Direct LDL (Unit Code: [phone]).           Total Cholesterol/HDL Ratio:CHD Risk                             Coronary Heart Disease Risk Table                                            Men       Women              1/2 Average Risk              3.4        3.3                  Average Risk              5.0        4.4               2X Average Risk              9.6        7.1               3X Average Risk             23.4       11.0     Use the calculated Patient Ratio above and the CHD Risk table      to determine the patient's CHD Risk.     ATP III Classification (LDL):           < 100        mg/dL         Optimal          100 - 129     mg/dL         Near or Above Optimal          130 - 159     mg/dL         Borderline High          160 - 189     mg/dL         High           > 190        mg/dL         Very High        TSH     Status: None   Collection Time    06/08/13 10:12 AM      Result Value Ref Range   TSH 1.362  0.350 - 4.500 uIU/mL  BASIC METABOLIC PANEL     Status: Abnormal   Collection Time    06/08/13 10:12 AM  Result Value Ref Range   Sodium 134 (*) 135 - 145 mEq/L   Potassium 4.4  3.5 - 5.3 mEq/L   Chloride 96  96 - 112 mEq/L   CO2 24  19 - 32 mEq/L   Glucose, Bld 335 (*) 70 - 99 mg/dL   BUN 9  6 - 23 mg/dL   Creat 0.64  0.50 - 1.35 mg/dL   Calcium 9.3  8.4 - 10.5 mg/dL  PSA     Status: None   Collection Time    06/08/13 10:12 AM      Result Value Ref Range   PSA 2.33  <=4.00 ng/mL   Comment: Test Methodology: ECLIA PSA (Electrochemiluminescence Immunoassay)           For PSA values from 2.5-4.0, particularly in younger men <60 years     old, the AUA and NCCN suggest testing for % Free PSA (3515) and     evaluation of the rate of increase in PSA (PSA velocity).  HEPATIC FUNCTION PANEL     Status: None   Collection Time    06/08/13 10:12 AM      Result Value Ref Range   Total Bilirubin 0.4  0.3 - 1.2 mg/dL   Bilirubin, Direct 0.1  0.0 - 0.3 mg/dL   Indirect Bilirubin 0.3  0.0 - 0.9 mg/dL   Alkaline Phosphatase 105  39 - 117 U/L   AST  16  0 - 37 U/L   ALT 17  0 - 53 U/L   Total Protein 6.9  6.0 - 8.3 g/dL   Albumin 4.6  3.5 - 5.2 g/dL  HEMOGLOBIN A1C     Status: Abnormal   Collection Time    06/08/13 10:12 AM      Result Value Ref Range   Hemoglobin A1C 14.8 (*) <5.7 %   Comment:                                                                            According to the ADA Clinical Practice Recommendations for 2011, when     HbA1c is used as a screening test:             >=6.5%   Diagnostic of Diabetes Mellitus                (if abnormal result is confirmed)           5.7-6.4%   Increased risk of developing Diabetes Mellitus           References:Diagnosis and Classification of Diabetes Mellitus,Diabetes     PNPY,0511,02(TRZNB 1):S62-S69 and Standards of Medical Care in             Diabetes - 2011,Diabetes Care,2011,34 (Suppl 1):S11-S61.         Mean Plasma Glucose 378 (*) <117 mg/dL  MICROALBUMIN / CREATININE URINE RATIO     Status: Abnormal   Collection Time    06/08/13 10:12 AM      Result Value Ref Range   Microalb, Ur 9.56 (*) 0.00 - 1.89 mg/dL   Creatinine, Urine 26.8     Microalb Creat Ratio 356.7 (*) 0.0 - 30.0 mg/g  URINALYSIS, MICROSCOPIC ONLY  Status: None   Collection Time    06/08/13 10:12 AM      Result Value Ref Range   Squamous Epithelial / LPF NONE SEEN  RARE   Crystals NONE SEEN  NONE SEEN   Casts NONE SEEN  NONE SEEN   WBC, UA 0-2  <3 WBC/hpf   RBC / HPF 0-2  <3 RBC/hpf   Bacteria, UA NONE SEEN  RARE  LIPID PANEL     Status: Abnormal   Collection Time    06/22/13  9:48 AM      Result Value Ref Range   Cholesterol 197  0 - 200 mg/dL   Comment: ATP III Classification:           < 200        mg/dL        Desirable          200 - 239     mg/dL        Borderline High          >= 240        mg/dL        High         Triglycerides 84  <150 mg/dL   HDL 68  >39 mg/dL   Total CHOL/HDL Ratio 2.9     VLDL 17  0 - 40 mg/dL   LDL Cholesterol 112 (*) 0 - 99 mg/dL   Comment:        Total Cholesterol/HDL Ratio:CHD Risk                            Coronary Heart Disease Risk Table                                            Men       Women              1/2 Average Risk              3.4        3.3                  Average Risk              5.0        4.4               2X Average Risk              9.6        7.1               3X Average Risk             23.4       11.0     Use the calculated Patient Ratio above and the CHD Risk table      to determine the patient's CHD Risk.     ATP III Classification (LDL):           < 100        mg/dL         Optimal          100 - 129     mg/dL         Near or Above Optimal          130 - 159  mg/dL         Borderline High          160 - 189     mg/dL         High           > 190        mg/dL         Very High        LDL CHOLESTEROL, DIRECT     Status: Abnormal   Collection Time    06/22/13  9:48 AM      Result Value Ref Range   Direct LDL 122 (*)    Comment: ATP III Classification (LDL):           < 100        mg/dL         Optimal          100 - 129     mg/dL         Near or Above Optimal          130 - 159     mg/dL         Borderline High          160 - 189     mg/dL         High           > 190        mg/dL         Very High          Assessment/Plan: DIABETES MELLITUS, TYPE II, UNCONTROLLED Diabetic foot exam completed.  No abnormalities.  Medications refilled.  Will switch from Lantus to Levemir as it is cheaper with his insurance.  Same dosing for now.  Discussed complications of non-compliance and uncontrolled diabetes.  Patient to check blood sugar twice daily.  Record in journal.  Bring to follow-up in 1 month.  Will recheck BMP and A1C at that time.

## 2013-08-24 NOTE — Assessment & Plan Note (Signed)
Diabetic foot exam completed.  No abnormalities.  Medications refilled.  Will switch from Lantus to Levemir as it is cheaper with his insurance.  Same dosing for now.  Discussed complications of non-compliance and uncontrolled diabetes.  Patient to check blood sugar twice daily.  Record in journal.  Bring to follow-up in 1 month.  Will recheck BMP and A1C at that time.

## 2013-08-24 NOTE — Patient Instructions (Signed)
Please restart medications.  Monitor blood glucose twice daily.  Record in journal.  Bring with you to follow-up in 1 month.  Will need to recheck A1C at that time.  Please see Ophthalmology as directed.  Continue follow-up with Podiatry.  Give some more thought to a nutrition class and seeing a specialist.    Diabetes and Standards of Medical Care  Diabetes is complicated. You may find that your diabetes team includes a dietitian, nurse, diabetes educator, eye doctor, and more. To help everyone know what is going on and to help you get the care you deserve, the following schedule of care was developed to help keep you on track. Below are the tests, exams, vaccines, medicines, education, and plans you will need. HbA1c test This test shows how well you have controlled your glucose over the past 2 3 months. It is used to see if your diabetes management plan needs to be adjusted.   It is performed at least 2 times a year if you are meeting treatment goals.  It is performed 4 times a year if therapy has changed or if you are not meeting treatment goals. Blood pressure test  This test is performed at every routine medical visit. The goal is less than 140/90 mmHg for most people, but 130/80 mmHg in some cases. Ask your health care provider about your goal. Dental exam  Follow up with the dentist regularly. Eye exam  If you are diagnosed with type 1 diabetes as a child, get an exam upon reaching the age of 37 years or older and have had diabetes for 3 5 years. Yearly eye exams are recommended after that initial eye exam.  If you are diagnosed with type 1 diabetes as an adult, get an exam within 5 years of diagnosis and then yearly.  If you are diagnosed with type 2 diabetes, get an exam as soon as possible after the diagnosis and then yearly. Foot care exam  Visual foot exams are performed at every routine medical visit. The exams check for cuts, injuries, or other problems with the feet.  A  comprehensive foot exam should be done yearly. This includes visual inspection as well as assessing foot pulses and testing for loss of sensation.  Check your feet nightly for cuts, injuries, or other problems with your feet. Tell your health care provider if anything is not healing. Kidney function test (urine microalbumin)  This test is performed once a year.  Type 1 diabetes: The first test is performed 5 years after diagnosis.  Type 2 diabetes: The first test is performed at the time of diagnosis.  A serum creatinine and estimated glomerular filtration rate (eGFR) test is done once a year to assess the level of chronic kidney disease (CKD), if present. Lipid profile (cholesterol, HDL, LDL, triglycerides)  Performed every 5 years for most people.  The goal for LDL is less than 100 mg/dL. If you are at high risk, the goal is less than 70 mg/dL.  The goal for HDL is 40 mg/dL 50 mg/dL for men and 50 mg/dL 60 mg/dL for women. An HDL cholesterol of 60 mg/dL or higher gives some protection against heart disease.  The goal for triglycerides is less than 150 mg/dL. Influenza vaccine, pneumococcal vaccine, and hepatitis B vaccine  The influenza vaccine is recommended yearly.  The pneumococcal vaccine is generally given once in a lifetime. However, there are some instances when another vaccination is recommended. Check with your health care provider.  The hepatitis  B vaccine is also recommended for adults with diabetes. Diabetes self-management education  Education is recommended at diagnosis and ongoing as needed. Treatment plan  Your treatment plan is reviewed at every medical visit. Document Released: 03/15/2009 Document Revised: 01/18/2013 Document Reviewed: 10/18/2012 [facility] Patient Information 2014 [place].

## 2013-10-25 ENCOUNTER — Ambulatory Visit: Payer: BC Managed Care – PPO | Admitting: Physician Assistant

## 2013-10-25 ENCOUNTER — Telehealth: Payer: Self-pay | Admitting: *Deleted

## 2013-10-25 NOTE — Telephone Encounter (Signed)
Patient had 7:15a appointment scheduled today, 05.27.15, that was canceled. Pt has been advised by Provider in a very direct manner, as to what the consequences could be, if his Diabetes & HTN are not kept in control [last A1C: 14.8 Fasting CBG: 352] [Lipids/Triglycerides: 1884] in January 2015. Requested that pt please call office and either reschedule f/u appt and/or speak with me or Selena Batten if there's anything that we can do to help him keep appointments/SLS

## 2013-11-07 ENCOUNTER — Telehealth: Payer: Self-pay | Admitting: Physician Assistant

## 2013-11-07 NOTE — Telephone Encounter (Signed)
°  Spoke to patient and informed him of what [place] states. Patient states that he does not know his work schedule so he will have to call us back to schedule this appointment. I also informed patient that he needs to call [facility] to reschedule appointment with them as well.      Can you call Mr. Rolnick, listed above, and remind him he is well overdue for a follow-up on his poorly-controlled diabetes. He has canceled his last appointment. Please iterate to him that it is very important he take his diabetes very seriously, as it leads to multiple health issues. I also see where he canceled his appointment with Podiatry ([facility]). This is the second time he has done that. He needs to call them to schedule an appointment.    [place],   I am making you aware of this because the patient has been very noncompliant with medications, recommendation and follow-ups. He was previously a patient of [facility] and was very noncompliant. He has very poorly-controlled diabetes with multiple complications already. If he refuses to comply with care and does not take an interest in his own health, I will have to release him from our practice.

## 2013-11-12 ENCOUNTER — Other Ambulatory Visit: Payer: Self-pay | Admitting: Physician Assistant

## 2013-12-27 ENCOUNTER — Encounter: Payer: Self-pay | Admitting: Physician Assistant

## 2013-12-27 ENCOUNTER — Ambulatory Visit (INDEPENDENT_AMBULATORY_CARE_PROVIDER_SITE_OTHER): Payer: BC Managed Care – PPO | Admitting: Physician Assistant

## 2013-12-27 VITALS — BP 120/80 | HR 89 | Temp 98.2°F | Resp 16 | Ht 63.5 in | Wt 137.0 lb

## 2013-12-27 DIAGNOSIS — E11329 Type 2 diabetes mellitus with mild nonproliferative diabetic retinopathy without macular edema: Secondary | ICD-10-CM

## 2013-12-27 DIAGNOSIS — E1165 Type 2 diabetes mellitus with hyperglycemia: Secondary | ICD-10-CM

## 2013-12-27 DIAGNOSIS — IMO0001 Reserved for inherently not codable concepts without codable children: Secondary | ICD-10-CM

## 2013-12-27 DIAGNOSIS — E1139 Type 2 diabetes mellitus with other diabetic ophthalmic complication: Secondary | ICD-10-CM | POA: Insufficient documentation

## 2013-12-27 LAB — LIPID PANEL
Cholesterol: 178 mg/dL (ref 0–200)
HDL: 54 mg/dL (ref >39)
LDL CALC: 104 mg/dL — AB (ref 0–99)
Total CHOL/HDL Ratio: 3.3 Ratio
Triglycerides: 102 mg/dL (ref <150)
VLDL: 20 mg/dL (ref 0–40)

## 2013-12-27 LAB — BASIC METABOLIC PANEL WITH GFR
BUN: 13 mg/dL (ref 6–23)
CALCIUM: 9.4 mg/dL (ref 8.4–10.5)
CHLORIDE: 106 meq/L (ref 96–112)
CO2: 28 meq/L (ref 19–32)
CREATININE: 0.71 mg/dL (ref 0.50–1.35)
GFR, Est African American: >89 mL/min
Glucose, Bld: 115 mg/dL — ABNORMAL HIGH (ref 70–99)
Potassium: 4.4 mEq/L (ref 3.5–5.3)
SODIUM: 142 meq/L (ref 135–145)

## 2013-12-27 LAB — HEMOGLOBIN A1C
HEMOGLOBIN A1C: 8 % — AB (ref <5.7)
Mean Plasma Glucose: 183 mg/dL — ABNORMAL HIGH (ref <117)

## 2013-12-27 NOTE — Assessment & Plan Note (Signed)
Will obtain BMP, A1C, UA and lipid panel.  STOP PM Novolog.  Will decrease Levemir to 20 AM and 20 PM.  Continue Metformin.  Will alter medications further based on A1C results.  Referral placed to Opthalmology for diabetic eye exam.  Follow-up with Urology and Podiatry as scheduled.  Again reminded patient to check glucose at least once in the AM and once in the evening.  Write down numbers.  Bring to follow-up in 1 month.

## 2013-12-27 NOTE — Progress Notes (Signed)
Patient is a 63 year-old male with history of uncontrolled Type II DM who presents to clinic today for follow-up.  Patient is 3 months overdue for scheduled follow-up.  Has a history of medication noncompliance.  Last A1C check at 14.8.  Patient replaced of combination of metformin 500 mg BID, Levemir 20 units AM/20 units PM, and Novolog 5 units before dinner.  Patient endorses taking medications as directed.  Is overdue for repeat A1C.  Already has evidence of diabetic retinopathy and neuropathy.  Is non compliant with seeing Ophthalmology.  Patient was instructed to bring a log of his blood sugar measurements to clinic this AM, but did not.  Checks glucose levels when he remembers to.   States evening sugars after Levemir and Lantus will be in the 50s.  States AM blood sugars are around 100-140.   Foot Care -- Followed by Podiatry. Will check feet at today's visit. Opthalmology -- no current follow-up.  Will place referral.   Past Medical History  Diagnosis Date  . Diabetes mellitus type II, uncontrolled   . Diabetic retinopathy     Dr Robby Sermon proliferative  . Hyperlipidemia   . Hypertension   . Erectile dysfunction   . ADD (attention deficit disorder)     evaluated by psych in the past  . Non-compliance with treatment   . Chicken pox     Current Outpatient Prescriptions on File Prior to Visit  Medication Sig Dispense Refill  . aspirin 81 MG tablet Take 81 mg by mouth daily.      . insulin aspart (NOVOLOG FLEXPEN) 100 UNIT/ML injection Inject 15-30 Units into the skin daily before supper.  10 mL  3  . lisinopril (PRINIVIL,ZESTRIL) 10 MG tablet TAKE ONE TABLET BY MOUTH ONE TIME DAILY   30 tablet  0  . metFORMIN (GLUCOPHAGE) 500 MG tablet Take 1 tablet (500 mg total) by mouth 2 (two) times daily with a meal.  60 tablet  2  . omeprazole (PRILOSEC) 20 MG capsule Take 1 capsule (20 mg total) by mouth daily.  30 capsule  3  . pravastatin (PRAVACHOL) 40 MG tablet TAKE ONE TABLET BY MOUTH ONE  TIME DAILY   30 tablet  0   No current facility-administered medications on file prior to visit.    No Known Allergies  Family History  Problem Relation Age of Onset  . Hyperlipidemia Mother   . Other Father     Bronchiectasis  . Heart disease Neg Hx   . Colon cancer Neg Hx   . Asthma Mother   . Healthy Daughter     x3    History   Social History  . Marital Status: Married    Spouse Name: N/A    Number of Children: N/A  . Years of Education: N/A   Social History Main Topics  . Smoking status: Former Smoker    Quit date: 04/01/2013  . Smokeless tobacco: Never Used  . Alcohol Use: Yes     Comment: rare  . Drug Use: No  . Sexual Activity: None   Other Topics Concern  . None   Social History Narrative   Occupation: Education officer, environmental,  Retail banker      Married      3 daughters ages 33, 30, 73      Never Smoked      Alcohol use-no   Review of Systems - See HPI.  All other ROS are negative.  BP 120/80  Pulse 89  Temp(Src) 98.2 F (  36.8 C) (Oral)  Resp 16  Ht 5' 3.5" (1.613 m)  Wt 137 lb (62.143 kg)  BMI 23.88 kg/m2  SpO2 99%  Physical Exam  Vitals reviewed. Constitutional: He is oriented to person, place, and time and well-developed, well-nourished, and in no distress.  HENT:  Head: Normocephalic and atraumatic.  Eyes: Conjunctivae are normal. Pupils are equal, round, and reactive to light.  Neck: Neck supple.  Cardiovascular: Normal rate, regular rhythm, normal heart sounds and intact distal pulses.   Pulmonary/Chest: Effort normal and breath sounds normal. No respiratory distress. He has no wheezes. He has no rales. He exhibits no tenderness.  Neurological: He is alert and oriented to person, place, and time.  Skin: Skin is warm and dry. No rash noted.  Psychiatric: Affect normal.   Assessment/Plan: DIABETES MELLITUS, TYPE II, UNCONTROLLED Will obtain BMP, A1C, UA and lipid panel.  STOP PM Novolog.  Will decrease Levemir to 20 AM and 20 PM.  Continue  Metformin.  Will alter medications further based on A1C results.  Referral placed to Opthalmology for diabetic eye exam.  Follow-up with Urology and Podiatry as scheduled.  Again reminded patient to check glucose at least once in the AM and once in the evening.  Write down numbers.  Bring to follow-up in 1 month.

## 2013-12-27 NOTE — Patient Instructions (Signed)
Please obtain labs.  I will call you with your results.  You will be contacted by Opthalmology for an appointment.  Continue follow-up with Podiatry and Urology.  For now -- STOP Novolog.  Decrease Levemir to 20 units in the morning and 20 units in the evening.  Continue metformin.  We will likely be changing your medication regimen once I have your lab results.  Please continue to check blood sugar in the morning and the evening.  Write these down.  We will schedule follow-up for 1 month from today.

## 2013-12-28 LAB — URINALYSIS, ROUTINE W REFLEX MICROSCOPIC
BILIRUBIN URINE: NEGATIVE
Glucose, UA: NEGATIVE mg/dL
Hgb urine dipstick: NEGATIVE
KETONES UR: NEGATIVE mg/dL
Leukocytes, UA: NEGATIVE
NITRITE: NEGATIVE
PROTEIN: NEGATIVE mg/dL
SPECIFIC GRAVITY, URINE: 1.021 (ref 1.005–1.030)
Urobilinogen, UA: 0.2 mg/dL (ref 0.0–1.0)
pH: 6.5 (ref 5.0–8.0)

## 2014-01-10 ENCOUNTER — Other Ambulatory Visit: Payer: Self-pay | Admitting: Physician Assistant

## 2014-01-11 NOTE — Telephone Encounter (Signed)
Rx request to pharmacy/SLS  

## 2014-01-26 ENCOUNTER — Ambulatory Visit: Payer: BC Managed Care – PPO | Admitting: Physician Assistant

## 2014-01-26 DIAGNOSIS — Z0289 Encounter for other administrative examinations: Secondary | ICD-10-CM

## 2014-02-20 DIAGNOSIS — N529 Male erectile dysfunction, unspecified: Secondary | ICD-10-CM | POA: Insufficient documentation

## 2014-02-27 ENCOUNTER — Ambulatory Visit: Payer: BC Managed Care – PPO | Admitting: Physician Assistant

## 2014-02-27 DIAGNOSIS — Z0289 Encounter for other administrative examinations: Secondary | ICD-10-CM

## 2014-03-13 ENCOUNTER — Encounter: Payer: Self-pay | Admitting: Physician Assistant

## 2014-03-13 ENCOUNTER — Telehealth: Payer: Self-pay | Admitting: *Deleted

## 2014-03-13 ENCOUNTER — Ambulatory Visit: Payer: BC Managed Care – PPO | Admitting: Physician Assistant

## 2014-03-13 DIAGNOSIS — Z0289 Encounter for other administrative examinations: Secondary | ICD-10-CM

## 2014-03-13 NOTE — Telephone Encounter (Signed)
Thank you for making me aware.  Call patient to reschedule.  I am sending him a letter to warn him of possible dismissal due to cancellations and no-shows.

## 2014-03-13 NOTE — Telephone Encounter (Signed)
Pt did not show for appointment 03/13/2014 at 9:15am for "follow up"

## 2014-03-15 ENCOUNTER — Telehealth: Payer: Self-pay | Admitting: Physician Assistant

## 2014-03-15 NOTE — Telephone Encounter (Signed)
Sent Reminder of appointment and Dismissal Policy by Certified Mail to the patient 03/16/2014   Received the Return Receipt showing someone picked up letter 1021/2015

## 2014-03-27 ENCOUNTER — Ambulatory Visit (INDEPENDENT_AMBULATORY_CARE_PROVIDER_SITE_OTHER): Payer: BC Managed Care – PPO | Admitting: Physician Assistant

## 2014-03-27 ENCOUNTER — Encounter: Payer: Self-pay | Admitting: Physician Assistant

## 2014-03-27 VITALS — BP 152/75 | HR 108 | Temp 98.0°F | Resp 16 | Ht 63.5 in | Wt 129.0 lb

## 2014-03-27 DIAGNOSIS — E1165 Type 2 diabetes mellitus with hyperglycemia: Secondary | ICD-10-CM

## 2014-03-27 DIAGNOSIS — IMO0002 Reserved for concepts with insufficient information to code with codable children: Secondary | ICD-10-CM

## 2014-03-27 LAB — BASIC METABOLIC PANEL
BUN: 12 mg/dL (ref 6–23)
CO2: 25 meq/L (ref 19–32)
Calcium: 9.3 mg/dL (ref 8.4–10.5)
Chloride: 102 mEq/L (ref 96–112)
Creatinine, Ser: 0.7 mg/dL (ref 0.4–1.5)
GFR: 119.26 mL/min (ref >60.00)
GLUCOSE: 207 mg/dL — AB (ref 70–99)
Potassium: 4.2 mEq/L (ref 3.5–5.1)
SODIUM: 136 meq/L (ref 135–145)

## 2014-03-27 LAB — HEMOGLOBIN A1C: Hgb A1c MFr Bld: 8 % — ABNORMAL HIGH (ref 4.6–6.5)

## 2014-03-27 MED ORDER — INSULIN DETEMIR 100 UNIT/ML FLEXPEN: PEN_INJECTOR | SUBCUTANEOUS | Status: DC

## 2014-03-27 MED ORDER — METFORMIN HCL 500 MG PO TABS: 500.0000 mg | ORAL_TABLET | Freq: Two times a day (BID) | ORAL | Status: DC

## 2014-03-27 NOTE — Assessment & Plan Note (Signed)
Continue current regimen.  Reiterated importance of checking glucose twice daily and recording measurements.  Medications refilled. DC Humalog. Will check BMP and A1C today.  Will alter regimen based on results.

## 2014-03-27 NOTE — Patient Instructions (Signed)
Please continue medications as directed. Obtain labs. I will call you with your results. Please restart checking your blood sugar twice daily.  Write these down.  We will alter your medication regimen based on your lab results.    Follow-up in 3 months.  Diabetes and Foot Care Diabetes may cause you to have problems because of poor blood supply (circulation) to your feet and legs. This may cause the skin on your feet to become thinner, break easier, and heal more slowly. Your skin may become dry, and the skin may peel and crack. You may also have nerve damage in your legs and feet causing decreased feeling in them. You may not notice minor injuries to your feet that could lead to infections or more serious problems. Taking care of your feet is one of the most important things you can do for yourself.  HOME CARE INSTRUCTIONS  Wear shoes at all times, even in the house. Do not go barefoot. Bare feet are easily injured.  Check your feet daily for blisters, cuts, and redness. If you cannot see the bottom of your feet, use a mirror or ask someone for help.  Wash your feet with warm water (do not use hot water) and mild soap. Then pat your feet and the areas between your toes until they are completely dry. Do not soak your feet as this can dry your skin.  Apply a moisturizing lotion or petroleum jelly (that does not contain alcohol and is unscented) to the skin on your feet and to dry, brittle toenails. Do not apply lotion between your toes.  Trim your toenails straight across. Do not dig under them or around the cuticle. File the edges of your nails with an emery board or nail file.  Do not cut corns or calluses or try to remove them with medicine.  Wear clean socks or stockings every day. Make sure they are not too tight. Do not wear knee-high stockings since they may decrease blood flow to your legs.  Wear shoes that fit properly and have enough cushioning. To break in new shoes, wear them for  just a few hours a day. This prevents you from injuring your feet. Always look in your shoes before you put them on to be sure there are no objects inside.  Do not cross your legs. This may decrease the blood flow to your feet.  If you find a minor scrape, cut, or break in the skin on your feet, keep it and the skin around it clean and dry. These areas may be cleansed with mild soap and water. Do not cleanse the area with peroxide, alcohol, or iodine.  When you remove an adhesive bandage, be sure not to damage the skin around it.  If you have a wound, look at it several times a day to make sure it is healing.  Do not use heating pads or hot water bottles. They may burn your skin. If you have lost feeling in your feet or legs, you may not know it is happening until it is too late.  Make sure your health care provider performs a complete foot exam at least annually or more often if you have foot problems. Report any cuts, sores, or bruises to your health care provider immediately. SEEK MEDICAL CARE IF:   You have an injury that is not healing.  You have cuts or breaks in the skin.  You have an ingrown nail.  You notice redness on your legs or feet.  You feel burning or tingling in your legs or feet.  You have pain or cramps in your legs and feet.  Your legs or feet are numb.  Your feet always feel cold. SEEK IMMEDIATE MEDICAL CARE IF:   There is increasing redness, swelling, or pain in or around a wound.  There is a red line that goes up your leg.  Pus is coming from a wound.  You develop a fever or as directed by your health care provider.  You notice a bad smell coming from an ulcer or wound. Document Released: 05/15/2000 Document Revised: 01/18/2013 Document Reviewed: 10/25/2012 [facility] Patient Information 2015 [place], [place]. This information is not intended to replace advice given to you by your health care provider. Make sure you discuss any questions you have with  your health care provider.

## 2014-03-27 NOTE — Progress Notes (Signed)
Patient presents to clinic today for follow-up of diabetes. Patient currently on Levemir 20 AM and 20 PM.  Metformin 500 mg twice daily. Is watching his diet.  Has significantly reduced intake of sweets.  Starting to exercise more.  Is not checking blood sugar as directed.  Denies symptoms of hypoglycemia.  Is taking lisinopril as directed but has not taken medication this morning.  Asymptomatic.  BP 152/75 in clinic.  Past Medical History  Diagnosis Date  . Diabetes mellitus type II, uncontrolled   . Diabetic retinopathy     Dr Robby Sermonigby-non proliferative  . Hyperlipidemia   . Hypertension   . Erectile dysfunction   . ADD (attention deficit disorder)     evaluated by psych in the past  . Non-compliance with treatment   . Chicken pox     Current Outpatient Prescriptions on File Prior to Visit  Medication Sig Dispense Refill  . lisinopril (PRINIVIL,ZESTRIL) 10 MG tablet TAKE ONE TABLET BY MOUTH ONE TIME DAILY   30 tablet  2  . omeprazole (PRILOSEC) 20 MG capsule TAKE ONE CAPSULE BY MOUTH ONE TIME DAILY   30 capsule  2  . pravastatin (PRAVACHOL) 40 MG tablet TAKE ONE TABLET BY MOUTH ONE TIME DAILY   30 tablet  2  . insulin aspart (NOVOLOG FLEXPEN) 100 UNIT/ML injection Inject 15-30 Units into the skin daily before supper.  10 mL  3   No current facility-administered medications on file prior to visit.    No Known Allergies  Family History  Problem Relation Age of Onset  . Hyperlipidemia Mother   . Other Father     Bronchiectasis  . Heart disease Neg Hx   . Colon cancer Neg Hx   . Asthma Mother   . Healthy Daughter     x3    History   Social History  . Marital Status: Married    Spouse Name: N/A    Number of Children: N/A  . Years of Education: N/A   Social History Main Topics  . Smoking status: Former Smoker    Quit date: 04/01/2013  . Smokeless tobacco: Never Used  . Alcohol Use: Yes     Comment: rare  . Drug Use: No  . Sexual Activity: None   Other Topics  Concern  . None   Social History Narrative   Occupation: Education officer, environmentalastor,  Retail bankersmall business owner      Married      3 daughters ages 5820, 3419, 3712      Never Smoked      Alcohol use-no   Review of Systems - See HPI.  All other ROS are negative.  BP 152/75  Pulse 108  Temp(Src) 98 F (36.7 C) (Oral)  Resp 16  Ht 5' 3.5" (1.613 m)  Wt 129 lb (58.514 kg)  BMI 22.49 kg/m2  SpO2 100%  Physical Exam  Vitals reviewed. Constitutional: He is oriented to person, place, and time and well-developed, well-nourished, and in no distress.  HENT:  Head: Normocephalic and atraumatic.  Eyes: Conjunctivae are normal.  Cardiovascular: Normal rate, regular rhythm, normal heart sounds and intact distal pulses.   Pulmonary/Chest: Effort normal and breath sounds normal. No respiratory distress. He has no wheezes. He has no rales. He exhibits no tenderness.  Neurological: He is alert and oriented to person, place, and time.  Skin: Skin is warm and dry. No rash noted.  Psychiatric: Affect normal.   Assessment/Plan: Diabetes mellitus type II, uncontrolled Continue current regimen.  Reiterated importance  of checking glucose twice daily and recording measurements.  Medications refilled. DC Humalog. Will check BMP and A1C today.  Will alter regimen based on results.

## 2014-03-27 NOTE — Progress Notes (Signed)
Pre visit review using our clinic review tool, if applicable. No additional management support is needed unless otherwise documented below in the visit note/SLS  

## 2014-03-28 ENCOUNTER — Telehealth: Payer: Self-pay | Admitting: Physician Assistant

## 2014-03-28 DIAGNOSIS — E1165 Type 2 diabetes mellitus with hyperglycemia: Secondary | ICD-10-CM

## 2014-03-28 DIAGNOSIS — IMO0002 Reserved for concepts with insufficient information to code with codable children: Secondary | ICD-10-CM

## 2014-03-28 MED ORDER — METFORMIN HCL 1000 MG PO TABS: 1000.0000 mg | ORAL_TABLET | Freq: Two times a day (BID) | ORAL | Status: DC

## 2014-03-28 NOTE — Telephone Encounter (Signed)
A1C still at 8.0 which is the same value as last check.  While still a great improvement from a year ago, he is still above goal.  I want him to continue the same dose of his Levemir.  I am increasing his Metformin to 1000 mg twice daily. I have sent in a new Rx for 1000 mg tablets of the Metformin.  He is to take 1 tablet twice daily.  If he had leftover 500 mg tablets, he can take two of these twice daily until they run out.  Then start new prescription.  Remind him to check his sugar at least every morning before breakfast.  Write down.  Bring to follow-up in 3 months.  If fasting glucose begins to get 80 or lower, he is to call the office for new dosing instructions.

## 2014-03-30 NOTE — Telephone Encounter (Signed)
Patient informed, understood & agreed/SLS  

## 2014-05-20 ENCOUNTER — Other Ambulatory Visit: Payer: Self-pay | Admitting: Physician Assistant

## 2014-05-21 NOTE — Telephone Encounter (Signed)
Rx request to pharmacy/SLS  

## 2014-06-13 ENCOUNTER — Encounter: Payer: Self-pay | Admitting: Physician Assistant

## 2014-06-27 ENCOUNTER — Ambulatory Visit: Payer: BC Managed Care – PPO | Admitting: Physician Assistant

## 2014-09-27 ENCOUNTER — Ambulatory Visit (INDEPENDENT_AMBULATORY_CARE_PROVIDER_SITE_OTHER): Payer: 59 | Admitting: Medical

## 2014-09-27 ENCOUNTER — Encounter: Payer: Self-pay | Admitting: Medical

## 2014-09-27 ENCOUNTER — Other Ambulatory Visit: Payer: Self-pay | Admitting: Medical

## 2014-09-27 VITALS — BP 126/80 | HR 82 | Temp 97.9°F | Resp 15 | Ht 62.5 in | Wt 125.6 lb

## 2014-09-27 DIAGNOSIS — Z9119 Patient's noncompliance with other medical treatment and regimen: Secondary | ICD-10-CM | POA: Diagnosis not present

## 2014-09-27 DIAGNOSIS — I10 Essential (primary) hypertension: Secondary | ICD-10-CM | POA: Diagnosis not present

## 2014-09-27 DIAGNOSIS — IMO0002 Reserved for concepts with insufficient information to code with codable children: Secondary | ICD-10-CM

## 2014-09-27 DIAGNOSIS — E1165 Type 2 diabetes mellitus with hyperglycemia: Secondary | ICD-10-CM

## 2014-09-27 DIAGNOSIS — R202 Paresthesia of skin: Secondary | ICD-10-CM | POA: Diagnosis not present

## 2014-09-27 DIAGNOSIS — Z91199 Patient's noncompliance with other medical treatment and regimen due to unspecified reason: Secondary | ICD-10-CM

## 2014-09-27 DIAGNOSIS — E11319 Type 2 diabetes mellitus with unspecified diabetic retinopathy without macular edema: Secondary | ICD-10-CM

## 2014-09-27 DIAGNOSIS — E785 Hyperlipidemia, unspecified: Secondary | ICD-10-CM

## 2014-09-27 LAB — COMPREHENSIVE METABOLIC PANEL
ALBUMIN: 4.1 g/dL (ref 3.5–5.2)
ALK PHOS: 78 U/L (ref 39–117)
ALT: 20 U/L (ref 0–53)
AST: 14 U/L (ref 0–37)
BUN: 16 mg/dL (ref 6–23)
CO2: 28 mEq/L (ref 19–32)
Calcium: 9.3 mg/dL (ref 8.4–10.5)
Chloride: 100 mEq/L (ref 96–112)
Creat: 0.63 mg/dL (ref 0.50–1.35)
Glucose, Bld: 375 mg/dL — ABNORMAL HIGH (ref 70–99)
POTASSIUM: 4.2 meq/L (ref 3.5–5.3)
SODIUM: 135 meq/L (ref 135–145)
TOTAL PROTEIN: 6.6 g/dL (ref 6.0–8.3)
Total Bilirubin: 0.5 mg/dL (ref 0.2–1.2)

## 2014-09-27 LAB — POCT URINALYSIS DIPSTICK
Bilirubin, UA: NEGATIVE
Blood, UA: NEGATIVE
KETONES UA: NEGATIVE
LEUKOCYTES UA: NEGATIVE
Nitrite, UA: NEGATIVE
PH UA: 6
PROTEIN UA: NEGATIVE
Spec Grav, UA: 1.015
UROBILINOGEN UA: NEGATIVE

## 2014-09-27 LAB — CBC
HCT: 43 % (ref 39.0–52.0)
Hemoglobin: 15.1 g/dL (ref 13.0–17.0)
MCH: 30.1 pg (ref 26.0–34.0)
MCHC: 35.1 g/dL (ref 30.0–36.0)
MCV: 85.8 fL (ref 78.0–100.0)
MPV: 9.5 fL (ref 8.6–12.4)
Platelets: 195 10*3/uL (ref 150–400)
RBC: 5.01 MIL/uL (ref 4.22–5.81)
RDW: 12.9 % (ref 11.5–15.5)
WBC: 7 10*3/uL (ref 4.0–10.5)

## 2014-09-27 LAB — C-PEPTIDE: C PEPTIDE: 0.65 ng/mL — AB (ref 0.80–3.90)

## 2014-09-27 LAB — INSULIN, RANDOM: Insulin: 1.9 u[IU]/mL — ABNORMAL LOW (ref 2.0–19.6)

## 2014-09-27 LAB — POCT GLYCOSYLATED HEMOGLOBIN (HGB A1C): HEMOGLOBIN A1C: 12

## 2014-09-27 MED ORDER — LISINOPRIL 10 MG PO TABS: 10.0000 mg | ORAL_TABLET | Freq: Every day | ORAL | Status: DC

## 2014-09-27 MED ORDER — PRAVASTATIN SODIUM 40 MG PO TABS: 40.0000 mg | ORAL_TABLET | Freq: Every day | ORAL | Status: DC

## 2014-09-27 MED ORDER — INSULIN DETEMIR 100 UNIT/ML FLEXPEN: PEN_INJECTOR | SUBCUTANEOUS | Status: DC

## 2014-09-27 MED ORDER — INSULIN LISPRO 100 UNIT/ML ~~LOC~~ SOLN: 5.0000 [IU] | Freq: Three times a day (TID) | SUBCUTANEOUS | Status: DC

## 2014-09-27 NOTE — Progress Notes (Signed)
Subjective: Chief Complaint  Patient presents with  . new acute issues    patient is losing weight about 4lb in last 3 months, patient has not used any medication regularly for the last 3 months, having foot pain and burning esp if he is standing, patient is type 2 diabetic hasnt checked it at home in awhile because machine isnt working well, he has been seeing [facility] PCP unsure of name of PA   Here as a new patient today.   Had been seeing [facility] prior. 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   He has hx/o hypertension, diabetes type 2 uncontrolled, diabetic retinopathy, hyperlipidemia, microalbuminuria, erectile dysfunction, and hx/o noncompliance per chart records.  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 His wife notes that he hasn't been taking care of his health, hasn't been so good about taking his medication the last 3 months.  He was diagnosed with diabetes 20 years ago, was on mealtime insulin at one point.  He doesn't do any specific exercise, eats 2 times a day typically eating sandwiches.  His medications are listed above but he says that he is good if he is taking them once a week.  Not currently checking his sugars either.  Lately been having numbness in the extremities bilaterally    Diabets type 2 - supposed to be taking levemir 20u QHS, Metformin 1000mg  BID  HTN -he is supposed to be taking Lisinopril 10mg  daily  Hyperlipidemia - supposed to be taking pravacohl 40mg  daily  Past Medical History  Diagnosis Date  . Diabetes mellitus type II, uncontrolled   . Diabetic retinopathy     Dr Robby Sermonigby-non proliferative  . Hyperlipidemia   . Hypertension   . Erectile dysfunction   . ADD (attention deficit disorder)     evaluated by psych in the past  . Non-compliance with treatment   . Chicken pox    ROS as in  subjective  Objective: BP 126/80 mmHg  Pulse 82  Temp(Src) 97.9 F (36.6 C) (Oral)  Resp 15  Ht 5' 2.5" (1.588 m)  Wt 125 lb 9.6 oz (56.972 kg)  BMI 22.59 kg/m2  General appearance: alert, no distress, WD/WN Oral cavity: MMM, no lesions Neck: supple, no lymphadenopathy, no thyromegaly, no masses Heart: RRR, normal S1, S2, no murmurs Lungs: CTA bilaterally, no wheezes, rhonchi, or rales Abdomen: +bs, soft, non tender, non distended, no masses, no hepatomegaly, no splenomegaly Pulses: 2+ symmetric, upper and lower extremities, normal cap refill Ext: no edema    Assessment: Encounter Diagnoses  Name Primary?  . Type 2 diabetes mellitus with diabetic retinopathy and without macular edema, with unspecified retinopathy severity Yes  . Essential hypertension   . Hyperlipidemia   . Noncompliance   . Paresthesia      Plan: We had a long discussion about his health care. I reviewed prior records in the chart. It is obvious that he either doesn't know the magnitude of his disease process or just not compliant in general. I discussed the fact that given the long history of his diabetes, already having a complication of retinopathy and likely neuropathy; at the rate he is going he will continue to have worsening worse complications which could include heart disease, heart attack, amputations, blindness or other. Labs today, will likely need to add on hefty your insulin regimen, spent  a good deal of time talking about diet next her size as well

## 2014-10-03 ENCOUNTER — Ambulatory Visit: Payer: Self-pay | Admitting: Medical

## 2015-02-06 ENCOUNTER — Other Ambulatory Visit: Payer: Self-pay | Admitting: Medical

## 2015-03-14 ENCOUNTER — Ambulatory Visit: Payer: 59 | Admitting: Medical

## 2015-04-02 ENCOUNTER — Telehealth: Payer: Self-pay | Admitting: *Deleted

## 2015-04-02 NOTE — Telephone Encounter (Signed)
Pt signed ROI [facility] from [facility]. Forwarded to [place] to scan/email to medical records. JG//CMA

## 2015-08-26 ENCOUNTER — Telehealth: Payer: Self-pay | Admitting: *Deleted

## 2015-08-26 NOTE — Telephone Encounter (Signed)
Received request for Medical Records; forwarded to [place] for email/scan//SLS 03/27 

## 2015-10-15 ENCOUNTER — Encounter (HOSPITAL_COMMUNITY): Payer: Self-pay | Admitting: Nurse Practitioner

## 2015-10-15 ENCOUNTER — Emergency Department (HOSPITAL_COMMUNITY)
Admission: EM | Admit: 2015-10-15 | Discharge: 2015-10-15 | Disposition: A | Discharge status: Home / Self Care | Payer: No Typology Code available for payment source | Attending: Emergency Medicine | Admitting: Emergency Medicine

## 2015-10-15 ENCOUNTER — Emergency Department (HOSPITAL_COMMUNITY): Payer: No Typology Code available for payment source

## 2015-10-15 DIAGNOSIS — S199XXA Unspecified injury of neck, initial encounter: Secondary | ICD-10-CM | POA: Insufficient documentation

## 2015-10-15 DIAGNOSIS — M62830 Muscle spasm of back: Secondary | ICD-10-CM | POA: Diagnosis not present

## 2015-10-15 DIAGNOSIS — Y92410 Unspecified street and highway as the place of occurrence of the external cause: Secondary | ICD-10-CM | POA: Insufficient documentation

## 2015-10-15 DIAGNOSIS — I10 Essential (primary) hypertension: Secondary | ICD-10-CM | POA: Diagnosis not present

## 2015-10-15 DIAGNOSIS — Z79899 Other long term (current) drug therapy: Secondary | ICD-10-CM | POA: Diagnosis not present

## 2015-10-15 DIAGNOSIS — Z87438 Personal history of other diseases of male genital organs: Secondary | ICD-10-CM | POA: Diagnosis not present

## 2015-10-15 DIAGNOSIS — Z8659 Personal history of other mental and behavioral disorders: Secondary | ICD-10-CM | POA: Diagnosis not present

## 2015-10-15 DIAGNOSIS — Y9389 Activity, other specified: Secondary | ICD-10-CM | POA: Insufficient documentation

## 2015-10-15 DIAGNOSIS — Y998 Other external cause status: Secondary | ICD-10-CM | POA: Insufficient documentation

## 2015-10-15 DIAGNOSIS — S3992XA Unspecified injury of lower back, initial encounter: Secondary | ICD-10-CM | POA: Insufficient documentation

## 2015-10-15 DIAGNOSIS — Z794 Long term (current) use of insulin: Secondary | ICD-10-CM | POA: Insufficient documentation

## 2015-10-15 DIAGNOSIS — Z87891 Personal history of nicotine dependence: Secondary | ICD-10-CM | POA: Insufficient documentation

## 2015-10-15 DIAGNOSIS — M549 Dorsalgia, unspecified: Secondary | ICD-10-CM

## 2015-10-15 DIAGNOSIS — R202 Paresthesia of skin: Secondary | ICD-10-CM | POA: Insufficient documentation

## 2015-10-15 DIAGNOSIS — S29002A Unspecified injury of muscle and tendon of back wall of thorax, initial encounter: Secondary | ICD-10-CM | POA: Diagnosis not present

## 2015-10-15 DIAGNOSIS — E785 Hyperlipidemia, unspecified: Secondary | ICD-10-CM | POA: Diagnosis not present

## 2015-10-15 DIAGNOSIS — E113599 Type 2 diabetes mellitus with proliferative diabetic retinopathy without macular edema, unspecified eye: Secondary | ICD-10-CM | POA: Diagnosis not present

## 2015-10-15 DIAGNOSIS — Z9119 Patient's noncompliance with other medical treatment and regimen: Secondary | ICD-10-CM | POA: Diagnosis not present

## 2015-10-15 MED ORDER — CYCLOBENZAPRINE HCL 10 MG PO TABS: 10.0000 mg | ORAL_TABLET | Freq: Three times a day (TID) | ORAL | Status: DC | PRN

## 2015-10-15 MED ORDER — NAPROXEN 500 MG PO TABS: 500.0000 mg | ORAL_TABLET | Freq: Two times a day (BID) | ORAL | Status: DC | PRN
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
## 2016-01-23 ENCOUNTER — Other Ambulatory Visit: Payer: Self-pay | Admitting: Gastroenterology

## 2016-02-24 NOTE — Progress Notes (Addendum)
02-24-16 1120 Spoke with patient - he doses not intend to have procedure 02-25-16 with Dr. Laural BenesJohnson- spouse out of town- unable to provide transportation. He had thought she had procedure changed to another scheduled date and time. Spoke with Polly Cobiaanelle Armstrong of Dr. Laural BenesJohnson office to make aware of this. She stated , she will call paitient.

## 2016-02-25 ENCOUNTER — Ambulatory Visit (HOSPITAL_COMMUNITY)
Admission: RE | Admit: 2016-02-25 | Payer: BLUE CROSS/BLUE SHIELD | Source: Ambulatory Visit | Admitting: Gastroenterology

## 2016-02-25 ENCOUNTER — Encounter (HOSPITAL_COMMUNITY): Admission: RE | Payer: Self-pay | Source: Ambulatory Visit

## 2016-02-25 SURGERY — COLONOSCOPY WITH PROPOFOL
Anesthesia: Monitor Anesthesia Care

## 2016-02-25 MED ORDER — LIDOCAINE 2% (20 MG/ML) 5 ML SYRINGE
INTRAMUSCULAR | Status: AC
  Filled 2016-02-25: qty 5

## 2016-02-25 MED ORDER — PROPOFOL 10 MG/ML IV BOLUS
INTRAVENOUS | Status: AC
  Filled 2016-02-25: qty 40

## 2016-11-20 DIAGNOSIS — Z794 Long term (current) use of insulin: Secondary | ICD-10-CM | POA: Diagnosis not present

## 2016-11-20 DIAGNOSIS — E7800 Pure hypercholesterolemia, unspecified: Secondary | ICD-10-CM | POA: Diagnosis not present

## 2016-11-20 DIAGNOSIS — E113313 Type 2 diabetes mellitus with moderate nonproliferative diabetic retinopathy with macular edema, bilateral: Secondary | ICD-10-CM | POA: Diagnosis not present

## 2016-11-20 DIAGNOSIS — R609 Edema, unspecified: Secondary | ICD-10-CM | POA: Diagnosis not present

## 2016-11-20 DIAGNOSIS — Z7984 Long term (current) use of oral hypoglycemic drugs: Secondary | ICD-10-CM | POA: Diagnosis not present

## 2016-11-20 DIAGNOSIS — I10 Essential (primary) hypertension: Secondary | ICD-10-CM | POA: Diagnosis not present

## 2016-11-20 DIAGNOSIS — E1165 Type 2 diabetes mellitus with hyperglycemia: Secondary | ICD-10-CM | POA: Diagnosis not present

## 2016-11-20 DIAGNOSIS — R600 Localized edema: Secondary | ICD-10-CM | POA: Diagnosis not present

## 2016-11-20 DIAGNOSIS — B351 Tinea unguium: Secondary | ICD-10-CM | POA: Diagnosis not present

## 2016-11-20 DIAGNOSIS — Z1159 Encounter for screening for other viral diseases: Secondary | ICD-10-CM | POA: Diagnosis not present

## 2016-12-17 ENCOUNTER — Encounter: Payer: Self-pay | Admitting: Podiatry

## 2016-12-17 ENCOUNTER — Ambulatory Visit (INDEPENDENT_AMBULATORY_CARE_PROVIDER_SITE_OTHER): Payer: Medicare Other | Admitting: Podiatry

## 2016-12-17 VITALS — BP 134/79 | HR 72

## 2016-12-17 DIAGNOSIS — E119 Type 2 diabetes mellitus without complications: Secondary | ICD-10-CM | POA: Diagnosis not present

## 2016-12-17 DIAGNOSIS — M79676 Pain in unspecified toe(s): Secondary | ICD-10-CM | POA: Diagnosis not present

## 2016-12-17 DIAGNOSIS — B351 Tinea unguium: Secondary | ICD-10-CM

## 2016-12-17 NOTE — Progress Notes (Signed)
   Subjective:    Patient ID: Philip Holt, male    DOB: 04/05/1951, 66 y.o.   MRN: [id number]  HPI this patient presents the office with chief complaint of long thick fungus toenails.  He has especially thick and long nails on the first and second toes, left foot.  Patient states that he is diabetic and is taking insulin.  He says he's having eye problems and has difficulty trimming the nails himself.  He presents the office today for an evaluation and treatment of his long painful thick nails    Review of Systems  All other systems reviewed and are negative.      Objective:   Physical Exam GENERAL APPEARANCE: Alert, conversant. Appropriately groomed. No acute distress.  VASCULAR: Pedal pulses are  palpable at  [facility] bilateral.  Capillary refill time is immediate to all digits,  Normal temperature gradient.  Digital hair growth is present bilateral  NEUROLOGIC: sensation is normal to 5.07 monofilament at 5/5 sites bilateral.  Light touch is intact bilateral, Muscle strength normal.  MUSCULOSKELETAL: acceptable muscle strength, tone and stability bilateral.  Intrinsic muscluature intact bilateral.  Rectus appearance of foot and digits noted bilateral.  NAILS  . Patient has long thick painful nails 10.  No evidence of bacterial infection or drainage DERMATOLOGIC: skin color, texture, and turgor are within normal limits.  No preulcerative lesions or ulcers  are seen, no interdigital maceration noted.  No open lesions present.   No drainage noted.         Assessment & Plan:  Onychomycosis  B/L  Diabetes with no complications  IE  Debride nails  RTC 3 months.    Helane GuntherGregory Gearld Kerstein DPM

## 2017-01-06 DIAGNOSIS — E113313 Type 2 diabetes mellitus with moderate nonproliferative diabetic retinopathy with macular edema, bilateral: Secondary | ICD-10-CM | POA: Diagnosis not present

## 2017-01-06 DIAGNOSIS — Z794 Long term (current) use of insulin: Secondary | ICD-10-CM | POA: Diagnosis not present

## 2017-01-06 DIAGNOSIS — I10 Essential (primary) hypertension: Secondary | ICD-10-CM | POA: Diagnosis not present

## 2017-02-08 DIAGNOSIS — H25813 Combined forms of age-related cataract, bilateral: Secondary | ICD-10-CM | POA: Diagnosis not present

## 2017-02-08 DIAGNOSIS — H40013 Open angle with borderline findings, low risk, bilateral: Secondary | ICD-10-CM | POA: Diagnosis not present

## 2017-02-08 DIAGNOSIS — E113313 Type 2 diabetes mellitus with moderate nonproliferative diabetic retinopathy with macular edema, bilateral: Secondary | ICD-10-CM | POA: Diagnosis not present

## 2017-03-11 DIAGNOSIS — E113512 Type 2 diabetes mellitus with proliferative diabetic retinopathy with macular edema, left eye: Secondary | ICD-10-CM | POA: Diagnosis not present

## 2017-03-11 DIAGNOSIS — E113511 Type 2 diabetes mellitus with proliferative diabetic retinopathy with macular edema, right eye: Secondary | ICD-10-CM | POA: Diagnosis not present

## 2017-03-17 ENCOUNTER — Ambulatory Visit: Payer: Medicare Other | Admitting: Podiatry

## 2017-04-01 DIAGNOSIS — E113311 Type 2 diabetes mellitus with moderate nonproliferative diabetic retinopathy with macular edema, right eye: Secondary | ICD-10-CM | POA: Diagnosis not present

## 2017-05-07 DIAGNOSIS — H43392 Other vitreous opacities, left eye: Secondary | ICD-10-CM | POA: Diagnosis not present

## 2017-05-07 DIAGNOSIS — H40053 Ocular hypertension, bilateral: Secondary | ICD-10-CM | POA: Diagnosis not present

## 2017-05-07 DIAGNOSIS — E113512 Type 2 diabetes mellitus with proliferative diabetic retinopathy with macular edema, left eye: Secondary | ICD-10-CM | POA: Diagnosis not present

## 2017-05-07 DIAGNOSIS — E113311 Type 2 diabetes mellitus with moderate nonproliferative diabetic retinopathy with macular edema, right eye: Secondary | ICD-10-CM | POA: Diagnosis not present

## 2017-05-07 DIAGNOSIS — H3582 Retinal ischemia: Secondary | ICD-10-CM | POA: Diagnosis not present

## 2017-05-17 DIAGNOSIS — E113512 Type 2 diabetes mellitus with proliferative diabetic retinopathy with macular edema, left eye: Secondary | ICD-10-CM | POA: Diagnosis not present

## 2017-06-21 DIAGNOSIS — Z79899 Other long term (current) drug therapy: Secondary | ICD-10-CM | POA: Diagnosis not present

## 2017-06-21 DIAGNOSIS — R197 Diarrhea, unspecified: Secondary | ICD-10-CM | POA: Diagnosis not present

## 2017-06-21 DIAGNOSIS — E1165 Type 2 diabetes mellitus with hyperglycemia: Secondary | ICD-10-CM | POA: Diagnosis not present

## 2017-06-21 DIAGNOSIS — R634 Abnormal weight loss: Secondary | ICD-10-CM | POA: Diagnosis not present

## 2017-06-21 DIAGNOSIS — Z794 Long term (current) use of insulin: Secondary | ICD-10-CM | POA: Diagnosis not present

## 2017-07-08 DIAGNOSIS — E119 Type 2 diabetes mellitus without complications: Secondary | ICD-10-CM | POA: Diagnosis not present

## 2017-07-08 DIAGNOSIS — Z794 Long term (current) use of insulin: Secondary | ICD-10-CM | POA: Diagnosis not present

## 2017-07-19 DIAGNOSIS — Z794 Long term (current) use of insulin: Secondary | ICD-10-CM | POA: Diagnosis not present

## 2017-07-19 DIAGNOSIS — E119 Type 2 diabetes mellitus without complications: Secondary | ICD-10-CM | POA: Diagnosis not present

## 2017-07-29 DIAGNOSIS — Z5181 Encounter for therapeutic drug level monitoring: Secondary | ICD-10-CM | POA: Diagnosis not present

## 2017-07-29 DIAGNOSIS — R252 Cramp and spasm: Secondary | ICD-10-CM | POA: Diagnosis not present

## 2017-07-29 DIAGNOSIS — Z794 Long term (current) use of insulin: Secondary | ICD-10-CM | POA: Diagnosis not present

## 2017-07-29 DIAGNOSIS — E1165 Type 2 diabetes mellitus with hyperglycemia: Secondary | ICD-10-CM | POA: Diagnosis not present

## 2017-10-21 DIAGNOSIS — Z794 Long term (current) use of insulin: Secondary | ICD-10-CM | POA: Diagnosis not present

## 2017-10-21 DIAGNOSIS — Z5181 Encounter for therapeutic drug level monitoring: Secondary | ICD-10-CM | POA: Diagnosis not present

## 2017-10-21 DIAGNOSIS — E1165 Type 2 diabetes mellitus with hyperglycemia: Secondary | ICD-10-CM | POA: Diagnosis not present

## 2017-10-21 DIAGNOSIS — I10 Essential (primary) hypertension: Secondary | ICD-10-CM | POA: Diagnosis not present

## 2017-10-22 DIAGNOSIS — E1165 Type 2 diabetes mellitus with hyperglycemia: Secondary | ICD-10-CM | POA: Diagnosis not present

## 2017-10-28 DIAGNOSIS — H3582 Retinal ischemia: Secondary | ICD-10-CM | POA: Diagnosis not present

## 2017-10-28 DIAGNOSIS — E113511 Type 2 diabetes mellitus with proliferative diabetic retinopathy with macular edema, right eye: Secondary | ICD-10-CM | POA: Diagnosis not present

## 2017-10-28 DIAGNOSIS — H2513 Age-related nuclear cataract, bilateral: Secondary | ICD-10-CM | POA: Diagnosis not present

## 2017-10-28 DIAGNOSIS — E113512 Type 2 diabetes mellitus with proliferative diabetic retinopathy with macular edema, left eye: Secondary | ICD-10-CM | POA: Diagnosis not present

## 2017-11-01 DIAGNOSIS — E113512 Type 2 diabetes mellitus with proliferative diabetic retinopathy with macular edema, left eye: Secondary | ICD-10-CM | POA: Diagnosis not present

## 2017-11-03 DIAGNOSIS — H40033 Anatomical narrow angle, bilateral: Secondary | ICD-10-CM | POA: Diagnosis not present

## 2017-11-03 DIAGNOSIS — E113393 Type 2 diabetes mellitus with moderate nonproliferative diabetic retinopathy without macular edema, bilateral: Secondary | ICD-10-CM | POA: Diagnosis not present

## 2017-11-03 DIAGNOSIS — H25813 Combined forms of age-related cataract, bilateral: Secondary | ICD-10-CM | POA: Diagnosis not present

## 2017-11-03 DIAGNOSIS — H40023 Open angle with borderline findings, high risk, bilateral: Secondary | ICD-10-CM | POA: Diagnosis not present

## 2017-12-06 DIAGNOSIS — E113512 Type 2 diabetes mellitus with proliferative diabetic retinopathy with macular edema, left eye: Secondary | ICD-10-CM | POA: Diagnosis not present

## 2017-12-10 DIAGNOSIS — E113511 Type 2 diabetes mellitus with proliferative diabetic retinopathy with macular edema, right eye: Secondary | ICD-10-CM | POA: Diagnosis not present

## 2018-01-24 DIAGNOSIS — I10 Essential (primary) hypertension: Secondary | ICD-10-CM | POA: Diagnosis not present

## 2018-01-24 DIAGNOSIS — Z79899 Other long term (current) drug therapy: Secondary | ICD-10-CM | POA: Diagnosis not present

## 2018-01-24 DIAGNOSIS — E1165 Type 2 diabetes mellitus with hyperglycemia: Secondary | ICD-10-CM | POA: Diagnosis not present

## 2018-01-24 DIAGNOSIS — Z794 Long term (current) use of insulin: Secondary | ICD-10-CM | POA: Diagnosis not present

## 2018-02-14 DIAGNOSIS — I10 Essential (primary) hypertension: Secondary | ICD-10-CM | POA: Diagnosis not present

## 2018-02-14 DIAGNOSIS — B351 Tinea unguium: Secondary | ICD-10-CM | POA: Diagnosis not present

## 2018-02-14 DIAGNOSIS — Z125 Encounter for screening for malignant neoplasm of prostate: Secondary | ICD-10-CM | POA: Diagnosis not present

## 2018-02-14 DIAGNOSIS — Z1389 Encounter for screening for other disorder: Secondary | ICD-10-CM | POA: Diagnosis not present

## 2018-02-14 DIAGNOSIS — Z1211 Encounter for screening for malignant neoplasm of colon: Secondary | ICD-10-CM | POA: Diagnosis not present

## 2018-02-14 DIAGNOSIS — Z0000 Encounter for general adult medical examination without abnormal findings: Secondary | ICD-10-CM | POA: Diagnosis not present

## 2018-02-14 DIAGNOSIS — E113313 Type 2 diabetes mellitus with moderate nonproliferative diabetic retinopathy with macular edema, bilateral: Secondary | ICD-10-CM | POA: Diagnosis not present

## 2018-02-14 DIAGNOSIS — Z23 Encounter for immunization: Secondary | ICD-10-CM | POA: Diagnosis not present

## 2018-02-14 DIAGNOSIS — Z794 Long term (current) use of insulin: Secondary | ICD-10-CM | POA: Diagnosis not present

## 2018-02-14 DIAGNOSIS — E7800 Pure hypercholesterolemia, unspecified: Secondary | ICD-10-CM | POA: Diagnosis not present

## 2018-02-24 DIAGNOSIS — M25775 Osteophyte, left foot: Secondary | ICD-10-CM | POA: Diagnosis not present

## 2018-02-24 DIAGNOSIS — M79672 Pain in left foot: Secondary | ICD-10-CM | POA: Diagnosis not present

## 2018-02-24 DIAGNOSIS — M79671 Pain in right foot: Secondary | ICD-10-CM | POA: Diagnosis not present

## 2018-02-24 DIAGNOSIS — L600 Ingrowing nail: Secondary | ICD-10-CM | POA: Diagnosis not present

## 2018-02-24 DIAGNOSIS — M25774 Osteophyte, right foot: Secondary | ICD-10-CM | POA: Diagnosis not present

## 2018-02-24 DIAGNOSIS — B351 Tinea unguium: Secondary | ICD-10-CM | POA: Diagnosis not present

## 2018-03-07 DIAGNOSIS — E113511 Type 2 diabetes mellitus with proliferative diabetic retinopathy with macular edema, right eye: Secondary | ICD-10-CM | POA: Diagnosis not present

## 2018-04-11 DIAGNOSIS — E113511 Type 2 diabetes mellitus with proliferative diabetic retinopathy with macular edema, right eye: Secondary | ICD-10-CM | POA: Diagnosis not present

## 2018-04-26 DIAGNOSIS — Z5181 Encounter for therapeutic drug level monitoring: Secondary | ICD-10-CM | POA: Diagnosis not present

## 2018-04-26 DIAGNOSIS — E1165 Type 2 diabetes mellitus with hyperglycemia: Secondary | ICD-10-CM | POA: Diagnosis not present

## 2018-04-26 DIAGNOSIS — Z794 Long term (current) use of insulin: Secondary | ICD-10-CM | POA: Diagnosis not present

## 2018-05-13 ENCOUNTER — Ambulatory Visit (INDEPENDENT_AMBULATORY_CARE_PROVIDER_SITE_OTHER): Payer: Medicare Other | Admitting: Podiatry

## 2018-05-13 ENCOUNTER — Encounter: Payer: Self-pay | Admitting: Podiatry

## 2018-05-13 DIAGNOSIS — E119 Type 2 diabetes mellitus without complications: Secondary | ICD-10-CM

## 2018-05-13 DIAGNOSIS — M79676 Pain in unspecified toe(s): Secondary | ICD-10-CM | POA: Diagnosis not present

## 2018-05-13 DIAGNOSIS — B351 Tinea unguium: Secondary | ICD-10-CM | POA: Diagnosis not present

## 2018-05-13 NOTE — Progress Notes (Signed)
Complaint:  Visit Type: Patient returns to my office for continued preventative foot care services. Complaint: Patient states" my nails have grown long and thick and become painful to walk and wear shoes" Patient has been diagnosed with DM with eye  complications. The patient presents for preventative foot care services. No changes to ROS.  He presents with his family member  for treatment.  He has not been seen for over one year.  Podiatric Exam: Vascular: dorsalis pedis and posterior tibial pulses are palpable bilateral. Capillary return is immediate. Temperature gradient is WNL. Skin turgor WNL  Sensorium: Normal Semmes Weinstein monofilament test. Normal tactile sensation bilaterally. Nail Exam: Pt has thick disfigured discolored nails with subungual debris noted bilateral entire nail hallux through fifth toenails Ulcer Exam: There is no evidence of ulcer or pre-ulcerative changes or infection. Orthopedic Exam: Muscle tone and strength are WNL. No limitations in general ROM. No crepitus or effusions noted. Foot type and digits show no abnormalities. Bony prominences are unremarkable. Skin: No Porokeratosis. No infection or ulcers  Diagnosis:  Onychomycosis, , Pain in right toe, pain in left toes  Treatment & Plan Procedures and Treatment: Consent by patient was obtained for treatment procedures.   Debridement of mycotic and hypertrophic toenails, 1 through 5 bilateral and clearing of subungual debris. No ulceration, no infection noted.  Return Visit-Office Procedure: Patient instructed to return to the office for a follow up visit 3 months for continued evaluation and treatment.    Helane GuntherGregory Ngai Parcell DPM

## 2018-08-05 ENCOUNTER — Ambulatory Visit: Payer: Medicare Other | Admitting: Podiatry

## 2018-11-15 DIAGNOSIS — H25813 Combined forms of age-related cataract, bilateral: Secondary | ICD-10-CM | POA: Diagnosis not present

## 2018-11-15 DIAGNOSIS — H40033 Anatomical narrow angle, bilateral: Secondary | ICD-10-CM | POA: Diagnosis not present

## 2018-11-15 DIAGNOSIS — H401134 Primary open-angle glaucoma, bilateral, indeterminate stage: Secondary | ICD-10-CM | POA: Diagnosis not present

## 2018-11-15 DIAGNOSIS — E113391 Type 2 diabetes mellitus with moderate nonproliferative diabetic retinopathy without macular edema, right eye: Secondary | ICD-10-CM | POA: Diagnosis not present

## 2018-11-23 DIAGNOSIS — H25813 Combined forms of age-related cataract, bilateral: Secondary | ICD-10-CM | POA: Diagnosis not present

## 2018-11-23 DIAGNOSIS — E113512 Type 2 diabetes mellitus with proliferative diabetic retinopathy with macular edema, left eye: Secondary | ICD-10-CM | POA: Diagnosis not present

## 2018-11-23 DIAGNOSIS — E113511 Type 2 diabetes mellitus with proliferative diabetic retinopathy with macular edema, right eye: Secondary | ICD-10-CM | POA: Diagnosis not present

## 2018-11-23 DIAGNOSIS — H401131 Primary open-angle glaucoma, bilateral, mild stage: Secondary | ICD-10-CM | POA: Diagnosis not present

## 2018-11-23 DIAGNOSIS — H4312 Vitreous hemorrhage, left eye: Secondary | ICD-10-CM | POA: Diagnosis not present

## 2018-11-23 DIAGNOSIS — H3582 Retinal ischemia: Secondary | ICD-10-CM | POA: Diagnosis not present

## 2018-12-07 DIAGNOSIS — E113512 Type 2 diabetes mellitus with proliferative diabetic retinopathy with macular edema, left eye: Secondary | ICD-10-CM | POA: Diagnosis not present

## 2019-01-18 ENCOUNTER — Ambulatory Visit
Admission: RE | Admit: 2019-01-18 | Discharge: 2019-01-18 | Disposition: A | Discharge status: Home / Self Care | Payer: Medicare Other | Source: Ambulatory Visit | Attending: Internal Medicine | Admitting: Internal Medicine

## 2019-01-18 ENCOUNTER — Other Ambulatory Visit: Payer: Self-pay | Admitting: Internal Medicine

## 2019-01-18 DIAGNOSIS — Z794 Long term (current) use of insulin: Secondary | ICD-10-CM | POA: Diagnosis not present

## 2019-01-18 DIAGNOSIS — M2550 Pain in unspecified joint: Secondary | ICD-10-CM | POA: Diagnosis not present

## 2019-01-18 DIAGNOSIS — M25541 Pain in joints of right hand: Secondary | ICD-10-CM

## 2019-01-18 DIAGNOSIS — E1165 Type 2 diabetes mellitus with hyperglycemia: Secondary | ICD-10-CM | POA: Diagnosis not present

## 2019-01-18 DIAGNOSIS — M25542 Pain in joints of left hand: Secondary | ICD-10-CM | POA: Diagnosis not present

## 2019-01-18 DIAGNOSIS — E1140 Type 2 diabetes mellitus with diabetic neuropathy, unspecified: Secondary | ICD-10-CM | POA: Diagnosis not present

## 2019-01-18 DIAGNOSIS — Z79899 Other long term (current) drug therapy: Secondary | ICD-10-CM | POA: Diagnosis not present

## 2019-01-20 ENCOUNTER — Ambulatory Visit: Payer: Medicare Other | Admitting: Podiatry

## 2019-02-21 DIAGNOSIS — Z125 Encounter for screening for malignant neoplasm of prostate: Secondary | ICD-10-CM | POA: Diagnosis not present

## 2019-02-21 DIAGNOSIS — Z1389 Encounter for screening for other disorder: Secondary | ICD-10-CM | POA: Diagnosis not present

## 2019-02-21 DIAGNOSIS — Z23 Encounter for immunization: Secondary | ICD-10-CM | POA: Diagnosis not present

## 2019-02-21 DIAGNOSIS — Z0000 Encounter for general adult medical examination without abnormal findings: Secondary | ICD-10-CM | POA: Diagnosis not present

## 2019-02-21 DIAGNOSIS — I10 Essential (primary) hypertension: Secondary | ICD-10-CM | POA: Diagnosis not present

## 2019-02-21 DIAGNOSIS — E1165 Type 2 diabetes mellitus with hyperglycemia: Secondary | ICD-10-CM | POA: Diagnosis not present

## 2019-02-21 DIAGNOSIS — Z1211 Encounter for screening for malignant neoplasm of colon: Secondary | ICD-10-CM | POA: Diagnosis not present

## 2019-02-21 DIAGNOSIS — E113313 Type 2 diabetes mellitus with moderate nonproliferative diabetic retinopathy with macular edema, bilateral: Secondary | ICD-10-CM | POA: Diagnosis not present

## 2019-02-21 DIAGNOSIS — E7800 Pure hypercholesterolemia, unspecified: Secondary | ICD-10-CM | POA: Diagnosis not present

## 2019-02-21 DIAGNOSIS — Z794 Long term (current) use of insulin: Secondary | ICD-10-CM | POA: Diagnosis not present

## 2019-02-21 DIAGNOSIS — E1140 Type 2 diabetes mellitus with diabetic neuropathy, unspecified: Secondary | ICD-10-CM | POA: Diagnosis not present

## 2019-03-09 DIAGNOSIS — E113492 Type 2 diabetes mellitus with severe nonproliferative diabetic retinopathy without macular edema, left eye: Secondary | ICD-10-CM | POA: Diagnosis not present

## 2019-03-09 DIAGNOSIS — H40023 Open angle with borderline findings, high risk, bilateral: Secondary | ICD-10-CM | POA: Diagnosis not present

## 2019-03-09 DIAGNOSIS — H25813 Combined forms of age-related cataract, bilateral: Secondary | ICD-10-CM | POA: Diagnosis not present

## 2019-03-09 DIAGNOSIS — E113591 Type 2 diabetes mellitus with proliferative diabetic retinopathy without macular edema, right eye: Secondary | ICD-10-CM | POA: Diagnosis not present

## 2019-03-21 DIAGNOSIS — H25813 Combined forms of age-related cataract, bilateral: Secondary | ICD-10-CM | POA: Diagnosis not present

## 2019-03-21 DIAGNOSIS — H401131 Primary open-angle glaucoma, bilateral, mild stage: Secondary | ICD-10-CM | POA: Diagnosis not present

## 2019-03-21 DIAGNOSIS — H3582 Retinal ischemia: Secondary | ICD-10-CM | POA: Diagnosis not present

## 2019-03-21 DIAGNOSIS — E113511 Type 2 diabetes mellitus with proliferative diabetic retinopathy with macular edema, right eye: Secondary | ICD-10-CM | POA: Diagnosis not present

## 2019-03-21 DIAGNOSIS — H40053 Ocular hypertension, bilateral: Secondary | ICD-10-CM | POA: Diagnosis not present

## 2019-03-21 DIAGNOSIS — H4312 Vitreous hemorrhage, left eye: Secondary | ICD-10-CM | POA: Diagnosis not present

## 2019-03-21 DIAGNOSIS — E113512 Type 2 diabetes mellitus with proliferative diabetic retinopathy with macular edema, left eye: Secondary | ICD-10-CM | POA: Diagnosis not present

## 2019-04-07 DIAGNOSIS — H25811 Combined forms of age-related cataract, right eye: Secondary | ICD-10-CM | POA: Diagnosis not present

## 2019-04-21 DIAGNOSIS — Z5181 Encounter for therapeutic drug level monitoring: Secondary | ICD-10-CM | POA: Diagnosis not present

## 2019-04-21 DIAGNOSIS — R0989 Other specified symptoms and signs involving the circulatory and respiratory systems: Secondary | ICD-10-CM | POA: Diagnosis not present

## 2019-04-21 DIAGNOSIS — E1140 Type 2 diabetes mellitus with diabetic neuropathy, unspecified: Secondary | ICD-10-CM | POA: Diagnosis not present

## 2019-04-21 DIAGNOSIS — Z794 Long term (current) use of insulin: Secondary | ICD-10-CM | POA: Diagnosis not present

## 2019-04-21 DIAGNOSIS — E1165 Type 2 diabetes mellitus with hyperglycemia: Secondary | ICD-10-CM | POA: Diagnosis not present

## 2019-04-24 ENCOUNTER — Other Ambulatory Visit: Payer: Self-pay | Admitting: Internal Medicine

## 2019-04-24 DIAGNOSIS — R0989 Other specified symptoms and signs involving the circulatory and respiratory systems: Secondary | ICD-10-CM

## 2019-04-24 DIAGNOSIS — E1365 Other specified diabetes mellitus with hyperglycemia: Secondary | ICD-10-CM

## 2019-05-03 DIAGNOSIS — H2512 Age-related nuclear cataract, left eye: Secondary | ICD-10-CM | POA: Diagnosis not present

## 2019-05-04 ENCOUNTER — Ambulatory Visit
Admission: RE | Admit: 2019-05-04 | Discharge: 2019-05-04 | Disposition: A | Discharge status: Home / Self Care | Payer: Medicare Other | Source: Ambulatory Visit | Attending: Internal Medicine | Admitting: Internal Medicine

## 2019-05-04 DIAGNOSIS — R0989 Other specified symptoms and signs involving the circulatory and respiratory systems: Secondary | ICD-10-CM | POA: Diagnosis not present

## 2019-05-04 DIAGNOSIS — E1365 Other specified diabetes mellitus with hyperglycemia: Secondary | ICD-10-CM

## 2019-05-05 DIAGNOSIS — H25812 Combined forms of age-related cataract, left eye: Secondary | ICD-10-CM | POA: Diagnosis not present

## 2019-06-16 ENCOUNTER — Telehealth (HOSPITAL_COMMUNITY): Payer: Self-pay

## 2019-06-16 NOTE — Telephone Encounter (Signed)

## 2019-06-19 ENCOUNTER — Ambulatory Visit (INDEPENDENT_AMBULATORY_CARE_PROVIDER_SITE_OTHER): Payer: Medicare Other | Admitting: Surgery

## 2019-06-19 ENCOUNTER — Other Ambulatory Visit: Payer: Self-pay

## 2019-06-19 ENCOUNTER — Encounter: Payer: Self-pay | Admitting: Surgery

## 2019-06-19 VITALS — BP 143/79 | HR 79 | Temp 98.0°F | Resp 20 | Ht 62.5 in | Wt 135.7 lb

## 2019-06-19 DIAGNOSIS — I70213 Atherosclerosis of native arteries of extremities with intermittent claudication, bilateral legs: Secondary | ICD-10-CM

## 2019-06-19 NOTE — Progress Notes (Signed)
Vascular and Vein Specialist of [facility]  Patient name: Philip Holt MRN: [id number] DOB: August 29, 1950 Sex: male   REQUESTING PROVIDER:    Dr. Donette Larry   REASON FOR CONSULT:     PAD  HISTORY OF PRESENT ILLNESS:   Philip Holt is a 69 y.o. male, who is referred today for evaluation of lower extremity peripheral vascular disease.  Patient has a history of type 2 diabetes which is not well controlled.  This is complicated by diabetic retinopathy and erectile dysfunction.  He does take a statin for hypercholesterolemia.  He is medically managed for hypertension.  He is a former smoker.  Patient states that he will get cramping at night but not with activity.  He does have pins-and-needles in his foot occasionally.  He has some wounds on his shin that are slow to heal.  There were no active open wounds.  He does not have rest pain.  He does not endorse a specific distance claudication  PAST MEDICAL HISTORY    Past Medical History:  Diagnosis Date   ADD (attention deficit disorder)    evaluated by psych in the past   Chicken pox    Diabetes mellitus type II, uncontrolled (HCC)    Diabetic retinopathy    Dr Robby Sermon proliferative   Erectile dysfunction    Hyperlipidemia    Hypertension    Non-compliance with treatment      FAMILY HISTORY   Family History  Problem Relation Age of Onset   Hyperlipidemia Mother    Asthma Mother    Other Father        Bronchiectasis   Asthma Father    Healthy Daughter        x3   Heart disease Neg Hx    Colon cancer Neg Hx     SOCIAL HISTORY:   Social History   Socioeconomic History   Marital status: Married    Spouse name: Not on file   Number of children: Not on file   Years of education: Not on file   Highest education level: Not on file  Occupational History   Not on file  Tobacco Use   Smoking status: Former Smoker    Quit date: 04/01/2013    Years since quitting:  6.2   Smokeless tobacco: Never Used  Substance and Sexual Activity   Alcohol use: Yes    Comment: rare   Drug use: No   Sexual activity: Not on file  Other Topics Concern   Not on file  Social History Narrative   Occupation: Education officer, environmental,  Retail banker      Married      3 daughters ages 28, 47, 30      Never Smoked      Alcohol use-no   Social Determinants of Corporate investment banker Strain:    Difficulty of Paying Living Expenses: Not on file  Food Insecurity:    Worried About Programme researcher, broadcasting/film/video in the Last Year: Not on file   [employer] of Food in the Last Year: Not on file  Transportation Needs:    Lack of Transportation (Medical): Not on file   Lack of Transportation (Non-Medical): Not on file  Physical Activity:    Days of Exercise per Week: Not on file   Minutes of Exercise per Session: Not on file  Stress:    Feeling of Stress : Not on file  Social Connections:    Frequency of Communication with Friends and Family:  Not on file   Frequency of Social Gatherings with Friends and Family: Not on file   Attends Religious Services: Not on file   Active Member of Clubs or Organizations: Not on file   Attends Archivist Meetings: Not on file   Marital Status: Not on file  Intimate Partner Violence:    Fear of Current or Ex-Partner: Not on file   Emotionally Abused: Not on file   Physically Abused: Not on file   Sexually Abused: Not on file    ALLERGIES:    No Known Allergies  CURRENT MEDICATIONS:    Current Outpatient Medications  Medication Sig Dispense Refill   atorvastatin (LIPITOR) 40 MG tablet Take 40 mg by mouth daily.     cholecalciferol (VITAMIN D3) 25 MCG (1000 UNIT) tablet Take 1,000 Units by mouth daily.     co-enzyme Q-10 30 MG capsule Take 30 mg by mouth 3 (three) times daily.     insulin lispro (HUMALOG) 100 UNIT/ML injection Inject 0.05 mLs (5 Units total) into the skin 3 (three) times daily before meals.  10 mL 2   LANTUS SOLOSTAR 100 UNIT/ML Solostar Pen      metFORMIN (GLUCOPHAGE) 500 MG tablet Take 500 mg by mouth 2 (two) times daily with a meal.     naproxen (NAPROSYN) 500 MG tablet Take 1 tablet (500 mg total) by mouth 2 (two) times daily as needed for mild pain, moderate pain or headache (TAKE WITH MEALS.). 20 tablet 0   Omega-3 300 MG CAPS Take by mouth.     No current facility-administered medications for this visit.    REVIEW OF SYSTEMS:   [X]  denotes positive finding, [ ]  denotes negative finding Cardiac  Comments:  Chest pain or chest pressure:    Shortness of breath upon exertion:    Short of breath when lying flat:    Irregular heart rhythm:        Vascular    Pain in calf, thigh, or hip brought on by ambulation: x   Pain in feet at night that wakes you up from your sleep:  x   Blood clot in your veins: x   Leg swelling:  x       Pulmonary    Oxygen at home:    Productive cough:     Wheezing:         Neurologic    Sudden weakness in arms or legs:     Sudden numbness in arms or legs:     Sudden onset of difficulty speaking or slurred speech:    Temporary loss of vision in one eye:     Problems with dizziness:         Gastrointestinal    Blood in stool:      Vomited blood:         Genitourinary    Burning when urinating:     Blood in urine:        Psychiatric    Major depression:         Hematologic    Bleeding problems:    Problems with blood clotting too easily:        Skin    Rashes or ulcers:        Constitutional    Fever or chills:     PHYSICAL EXAM:   Vitals:   06/19/19 0911  BP: (!) 143/79  Pulse: 79  Resp: 20  Temp: 98 F (36.7 C)  SpO2: 99%  Weight: 135  lb 11.2 oz (61.6 kg)  Height: 5' 2.5" (1.588 m)    GENERAL: The patient is a well-nourished male, in no acute distress. The vital signs are documented above. CARDIAC: There is a regular rate and rhythm.  VASCULAR: Nonpalpable pedal pulses PULMONARY: Nonlabored  respirations ABDOMEN: Soft and non-tender with normal pitched bowel sounds.  MUSCULOSKELETAL: There are no major deformities or cyanosis. NEUROLOGIC: No focal weakness or paresthesias are detected. SKIN: There are no ulcers or rashes noted. PSYCHIATRIC: The patient has a normal affect.  STUDIES:   I have reviewed the following duplex studies:  1. Resting right ankle-brachial index of 0.72 consistent with at least moderate peripheral arterial disease. This appears to be due to a combination of inflow (iliac) and outflow (distal femoropopliteal) disease. 2. Resting left ankle-brachial index of 0.80 consistent with at least mild underlying peripheral arterial disease. This appears to be due to multifocal outflow (femoropopliteal) disease.   ASSESSMENT and PLAN   Atherosclerotic vascular disease, lower extremity: The patient has duplex findings of multifocal vascular disease both inflow and outflow, bilaterally.  He does not endorse any specific distance claudication.  He does not have open wounds.  At this point, I do not think revascularization is indicated.  We discussed focusing on correcting his medical issues such as improving his blood sugar, improving his cholesterol profile and working on his hypertension.  I did stress the importance of increasing his activity.  I have also recommended starting a 81 mg aspirin.  We discussed signs and symptoms of acute vascular insufficiency including rest pain, nonhealing wounds, and the inability to heal and infection.  All of which would require my immediate attention and he knows to contact me should he develop any of these.  Otherwise, I have him scheduled for follow-up and repeat evaluation in 1 year.   Charlena Cross, MD, FACS [facility] and Vein Specialists of [facility] [phone] Pager [phone]

## 2019-06-26 DIAGNOSIS — H2513 Age-related nuclear cataract, bilateral: Secondary | ICD-10-CM | POA: Diagnosis not present

## 2019-06-26 DIAGNOSIS — E113511 Type 2 diabetes mellitus with proliferative diabetic retinopathy with macular edema, right eye: Secondary | ICD-10-CM | POA: Diagnosis not present

## 2019-06-26 DIAGNOSIS — H3582 Retinal ischemia: Secondary | ICD-10-CM | POA: Diagnosis not present

## 2019-06-26 DIAGNOSIS — H401131 Primary open-angle glaucoma, bilateral, mild stage: Secondary | ICD-10-CM | POA: Diagnosis not present

## 2019-06-26 DIAGNOSIS — E113512 Type 2 diabetes mellitus with proliferative diabetic retinopathy with macular edema, left eye: Secondary | ICD-10-CM | POA: Diagnosis not present

## 2019-07-10 DIAGNOSIS — H3582 Retinal ischemia: Secondary | ICD-10-CM | POA: Diagnosis not present

## 2019-07-10 DIAGNOSIS — E113511 Type 2 diabetes mellitus with proliferative diabetic retinopathy with macular edema, right eye: Secondary | ICD-10-CM | POA: Diagnosis not present

## 2019-07-10 DIAGNOSIS — E113512 Type 2 diabetes mellitus with proliferative diabetic retinopathy with macular edema, left eye: Secondary | ICD-10-CM | POA: Diagnosis not present

## 2019-07-24 DIAGNOSIS — E113511 Type 2 diabetes mellitus with proliferative diabetic retinopathy with macular edema, right eye: Secondary | ICD-10-CM | POA: Diagnosis not present

## 2019-08-07 DIAGNOSIS — E113512 Type 2 diabetes mellitus with proliferative diabetic retinopathy with macular edema, left eye: Secondary | ICD-10-CM | POA: Diagnosis not present

## 2019-08-21 DIAGNOSIS — E113511 Type 2 diabetes mellitus with proliferative diabetic retinopathy with macular edema, right eye: Secondary | ICD-10-CM | POA: Diagnosis not present

## 2019-08-28 DIAGNOSIS — E113512 Type 2 diabetes mellitus with proliferative diabetic retinopathy with macular edema, left eye: Secondary | ICD-10-CM | POA: Diagnosis not present

## 2019-08-30 DIAGNOSIS — I739 Peripheral vascular disease, unspecified: Secondary | ICD-10-CM | POA: Diagnosis not present

## 2019-08-30 DIAGNOSIS — E1140 Type 2 diabetes mellitus with diabetic neuropathy, unspecified: Secondary | ICD-10-CM | POA: Diagnosis not present

## 2019-08-30 DIAGNOSIS — Z1211 Encounter for screening for malignant neoplasm of colon: Secondary | ICD-10-CM | POA: Diagnosis not present

## 2019-08-30 DIAGNOSIS — Z794 Long term (current) use of insulin: Secondary | ICD-10-CM | POA: Diagnosis not present

## 2019-08-30 DIAGNOSIS — E7800 Pure hypercholesterolemia, unspecified: Secondary | ICD-10-CM | POA: Diagnosis not present

## 2019-08-30 DIAGNOSIS — E1165 Type 2 diabetes mellitus with hyperglycemia: Secondary | ICD-10-CM | POA: Diagnosis not present

## 2019-08-30 DIAGNOSIS — E113313 Type 2 diabetes mellitus with moderate nonproliferative diabetic retinopathy with macular edema, bilateral: Secondary | ICD-10-CM | POA: Diagnosis not present

## 2019-08-30 DIAGNOSIS — I10 Essential (primary) hypertension: Secondary | ICD-10-CM | POA: Diagnosis not present

## 2019-09-06 DIAGNOSIS — E113511 Type 2 diabetes mellitus with proliferative diabetic retinopathy with macular edema, right eye: Secondary | ICD-10-CM | POA: Diagnosis not present

## 2019-09-11 DIAGNOSIS — E113512 Type 2 diabetes mellitus with proliferative diabetic retinopathy with macular edema, left eye: Secondary | ICD-10-CM | POA: Diagnosis not present

## 2019-09-25 DIAGNOSIS — E113511 Type 2 diabetes mellitus with proliferative diabetic retinopathy with macular edema, right eye: Secondary | ICD-10-CM | POA: Diagnosis not present

## 2019-11-13 ENCOUNTER — Other Ambulatory Visit: Payer: Self-pay

## 2019-11-13 ENCOUNTER — Ambulatory Visit (INDEPENDENT_AMBULATORY_CARE_PROVIDER_SITE_OTHER): Payer: Medicare Other | Admitting: Podiatrist

## 2019-11-13 ENCOUNTER — Encounter: Payer: Self-pay | Admitting: Podiatrist

## 2019-11-13 VITALS — Temp 97.2°F

## 2019-11-13 DIAGNOSIS — E0851 Diabetes mellitus due to underlying condition with diabetic peripheral angiopathy without gangrene: Secondary | ICD-10-CM

## 2019-11-13 DIAGNOSIS — M79676 Pain in unspecified toe(s): Secondary | ICD-10-CM

## 2019-11-13 DIAGNOSIS — R600 Localized edema: Secondary | ICD-10-CM

## 2019-11-13 DIAGNOSIS — I890 Lymphedema, not elsewhere classified: Secondary | ICD-10-CM | POA: Diagnosis not present

## 2019-11-13 DIAGNOSIS — B351 Tinea unguium: Secondary | ICD-10-CM

## 2019-11-13 DIAGNOSIS — I70213 Atherosclerosis of native arteries of extremities with intermittent claudication, bilateral legs: Secondary | ICD-10-CM

## 2019-11-13 NOTE — Patient Instructions (Signed)
I am sending you for a study to figure out if your veins are contributing to some of your swelling.  I have written a prescription for compression hose- you can go to any [facility] and they will measure you  I will start the process to get you some diabetic shoes-  We will call when ready to be measured.  I will see you back in 3-4 weeks or earlier to discuss results of the study and for further treatment.

## 2019-11-14 ENCOUNTER — Encounter: Payer: Self-pay | Admitting: Podiatrist

## 2019-11-14 NOTE — Progress Notes (Signed)
  Chief Complaint  Patient presents with  . Foot Swelling    Bilateral, "irregular" edema - foot, ankle, and below knees. Pt stated, "I've had it for years. Dr. Donette Larry ordered a vascular study about 5 months ago, but didn't have any recommendations. Sometimes the swelling improves, and my skin looks normal. I buy larger flip-flops due to the swelling, but I still swell too much for them".  . Diabetes    Pt stated, "My A1c is usually  very high - we're trying to control it. I've never had an ulcer. Sometimes I have numbness and tingling in my feet, and I feel more pain than usual if I step on something".     HPI: Patient is 69 y.o. male who presents today for the concerns as listed above.  He is here for bilateral foot swelling as well as for toenails he would like trimmed.  His is a poorly controlled diabetic.  He had a vascular study performed and office visit with Dr. Durene Cal where no surgical interventions were indicated.  He would like to know if there are any other recommendations for the swelling.   Review of Systems No fevers, chills, nausea, muscle aches, no difficulty breathing, no calf pain, no chest pain or shortness of breath.   Physical Exam  GENERAL APPEARANCE: Alert, conversant. Appropriately groomed. No acute distress.   VASCULAR: Pedal pulses palpable DP and PT bilateral.  Capillary refill time is immediate to all digits,  Proximal to distal cooling it warm to warm.  Digital hair growth is present bilateral.  Bilateral leg and foot swelling which is equal and symmetric in nature is noted.  Skin on the anterior shins are shiny.  No open lesions or ulcerations noted.  No variscosities noted.  Measurements carried out today:    Foot  Ankle  Calf Left   10 inches 9.5 in  13.25in Right   10 inches 9.5 in  12.50 in  NEUROLOGIC: sensation is intact epicritically and protectively to 5.07 monofilament at 5/5 sites bilateral.  Light touch is intact bilateral, vibratory sensation  intact bilateral, achilles tendon reflex is intact bilateral.   MUSCULOSKELETAL: acceptable muscle strength, tone and stability bilateral.  No gross boney pedal deformities noted.  No pain, crepitus or limitation noted with foot and ankle range of motion bilateral.   DERMATOLOGIC: skin is warm, supple, and dry.  No open lesions noted.  Shiny skin to the anterior shin noted bilateral.  No rash, no pre ulcerative lesions. Digital nails are thick, discolored, dystrophic and symptomatic x 10.     Assessment     ICD-10-CM   1. Bilateral lower extremity edema  R60.0   2. Diabetes mellitus due to underlying condition with diabetic peripheral angiopathy without gangrene, unspecified whether long term insulin use (HCC)  E08.51   3. Lymphedema of both lower extremities  I89.0   4. Pain due to onychomycosis of toenail  B35.1    M79.676      Plan  1.  Will order venous duplex ultrasound to access if this is a venous condition vs lymphedema. 2.  Will look into physical therapy for the edema 3.  rx for compression stockings written for [facility]. 4.  Debridement of toenails carried out manually and mechanically without complication.  He will be seen back in 3-4 weeks.

## 2019-11-16 ENCOUNTER — Telehealth: Payer: Self-pay | Admitting: *Deleted

## 2019-11-16 ENCOUNTER — Other Ambulatory Visit: Payer: Self-pay

## 2019-11-16 ENCOUNTER — Ambulatory Visit (HOSPITAL_COMMUNITY)
Admission: RE | Admit: 2019-11-16 | Discharge: 2019-11-16 | Disposition: A | Discharge status: Home / Self Care | Payer: Medicare Other | Source: Ambulatory Visit | Attending: Cardiology | Admitting: Cardiology

## 2019-11-16 DIAGNOSIS — E0851 Diabetes mellitus due to underlying condition with diabetic peripheral angiopathy without gangrene: Secondary | ICD-10-CM

## 2019-11-16 DIAGNOSIS — I890 Lymphedema, not elsewhere classified: Secondary | ICD-10-CM | POA: Insufficient documentation

## 2019-11-16 DIAGNOSIS — R600 Localized edema: Secondary | ICD-10-CM | POA: Insufficient documentation

## 2019-11-16 NOTE — Telephone Encounter (Signed)
-----   Message from Delories Heinz, DPM sent at 11/14/2019  9:21 PM EDT ----- Regarding: lymphedema pt referral I forgot to mention, the patient wanted to know if PT was an option   Apparently [facility] has an outpatient lymphedema program but I'm not sure how to order (I couldn't figure out which campus its held at)  The phone number for the treatment program is [phone].  Would you mind calling and asking of they treat lower extremity edema and if so, how a referral can be made?  Thanks so much!!  Dr. Bea Laura

## 2019-11-16 NOTE — Telephone Encounter (Signed)
Orders for venous reflux studies to evaluate Venous insuffiencey faxed to [facility].

## 2019-11-16 NOTE — Telephone Encounter (Signed)
-----   Message from Delories Heinz, DPM sent at 11/14/2019  9:16 PM EDT ----- Regarding: venous US bilateral le [facility] [place],  I need to order a bilateral venous duplex ultrasound to look for venous insufficiency in this patient with bilateral lower extremity edema.  Would you mind ordering for me please  :)  [facility] is the only place I know who does this test.  Thanks so much!  Dr. Bea Laura

## 2019-11-17 NOTE — Telephone Encounter (Signed)
Faxed orders to [facility] with clinicals, and demographics.

## 2019-11-21 NOTE — Telephone Encounter (Signed)
Thank you for sorting out the lymphedema massage location.  Philip Holt should work out well if they can get him in. Thanks!

## 2019-12-25 DIAGNOSIS — E113512 Type 2 diabetes mellitus with proliferative diabetic retinopathy with macular edema, left eye: Secondary | ICD-10-CM | POA: Diagnosis not present

## 2019-12-27 ENCOUNTER — Encounter: Payer: Self-pay | Admitting: Podiatrist

## 2019-12-27 ENCOUNTER — Ambulatory Visit (INDEPENDENT_AMBULATORY_CARE_PROVIDER_SITE_OTHER): Payer: Medicare Other | Admitting: Podiatrist

## 2019-12-27 ENCOUNTER — Other Ambulatory Visit: Payer: Self-pay

## 2019-12-27 DIAGNOSIS — E0840 Diabetes mellitus due to underlying condition with diabetic neuropathy, unspecified: Secondary | ICD-10-CM

## 2019-12-27 DIAGNOSIS — I890 Lymphedema, not elsewhere classified: Secondary | ICD-10-CM

## 2019-12-27 DIAGNOSIS — R600 Localized edema: Secondary | ICD-10-CM

## 2019-12-27 DIAGNOSIS — I70213 Atherosclerosis of native arteries of extremities with intermittent claudication, bilateral legs: Secondary | ICD-10-CM

## 2019-12-27 NOTE — Patient Instructions (Signed)
Peripheral Edema  Peripheral edema is swelling that is caused by a buildup of fluid. Peripheral edema most often affects the lower legs, ankles, and feet. It can also develop in the arms, hands, and face. The area of the body that has peripheral edema will look swollen. It may also feel heavy or warm. Your clothes may start to feel tight. Pressing on the area may make a temporary dent in your skin. You may not be able to move your swollen arm or leg as much as usual. There are many causes of peripheral edema. It can happen because of a complication of other conditions such as congestive heart failure, kidney disease, or a problem with your blood circulation. It also can be a side effect of certain medicines or because of an infection. It often happens to women during pregnancy. Sometimes, the cause is not known. Follow these instructions at home: Managing pain, stiffness, and swelling   Raise (elevate) your legs while you are sitting or lying down.  Move around often to prevent stiffness and to lessen swelling.  Do not sit or stand for long periods of time.  Wear support stockings as told by your health care provider. Medicines  Take over-the-counter and prescription medicines only as told by your health care provider.  Your health care provider may prescribe medicine to help your body get rid of excess water (diuretic). General instructions  Pay attention to any changes in your symptoms.  Follow instructions from your health care provider about limiting salt (sodium) in your diet. Sometimes, eating less salt may reduce swelling.  Moisturize skin daily to help prevent skin from cracking and draining.  Keep all follow-up visits as told by your health care provider. This is important. Contact a health care provider if you have:  A fever.  Edema that starts suddenly or is getting worse, especially if you are pregnant or have a medical condition.  Swelling in only one leg.  Increased  swelling, redness, or pain in one or both of your legs.  Drainage or sores at the area where you have edema. Get help right away if you:  Develop shortness of breath, especially when you are lying down.  Have pain in your chest or abdomen.  Feel weak.  Feel faint. Summary  Peripheral edema is swelling that is caused by a buildup of fluid. Peripheral edema most often affects the lower legs, ankles, and feet.  Move around often to prevent stiffness and to lessen swelling. Do not sit or stand for long periods of time.  Pay attention to any changes in your symptoms.  Contact a health care provider if you have edema that starts suddenly or is getting worse, especially if you are pregnant or have a medical condition.  Get help right away if you develop shortness of breath, especially when lying down. This information is not intended to replace advice given to you by your health care provider. Make sure you discuss any questions you have with your health care provider. Document Revised: 02/09/2018 Document Reviewed: 02/09/2018 Elsevier Patient Education  2020 [employer] Inc[employer]  

## 2019-12-27 NOTE — Progress Notes (Addendum)
Chief Complaint  Patient presents with  . Foot Pain    pt is here for foot bil edema in both feet. Pt is also here for an ultrasound f/u as well.     HPI: Patient is 69 y.o. male who presents today for follow up.   He is here for bilateral foot swelling as well as for ultrasound results.  His is a poorly controlled diabetic.  He has shooting and numbness consistent with his peripheral neuropathy. He has an appointment with the lymphatic treatment clinic on August 3rd.    Physical Exam   GENERAL APPEARANCE: Alert, conversant. Appropriately groomed. No acute distress.    VASCULAR: Pedal pulses palpable DP and PT bilateral.  Capillary refill time is immediate to all digits,  Proximal to distal cooling it warm to warm.  Digital hair growth is present bilateral.  Bilateral leg and foot swelling which is equal and symmetric in nature is noted.  Skin on the anterior shins are shiny.  No open lesions or ulcerations noted.  No variscosities noted.  Measurements carried out today unchanged from last visit:                                     Foot                 Ankle               Calf Left                              10 inches         9.5 in               13.25in Right                            10 inches         9.5 in               12.50 in   NEUROLOGIC: sensation is decreased epicritically and protectively to 5.07 monofilament at 3/5 sites bilateral.  Light touch is decreased bilateral, vibratory sensation intact bilateral, achilles tendon reflex is intact bilateral.    MUSCULOSKELETAL: acceptable muscle strength, tone and stability bilateral.  No gross boney pedal deformities noted.  No pain, crepitus or limitation noted with foot and ankle range of motion bilateral.  Pes planus bilateral noted.    DERMATOLOGIC: skin is warm, supple, and dry.  No open lesions noted.  Shiny , atrophic, edematous skin to bilateral lower legs and feet noted.. Pitting edema present on feet, ankles and lower legs.  pt  has failed compression, elevation, exercise for 4 weeks" and description of the skin of the legs       Assessment:     ICD-10-CM   1. Lymphedema of both lower extremities  I89.0   2. Bilateral lower extremity edema  R60.0   3. Diabetes mellitus due to underlying condition with diabetic neuropathy, unspecified whether long term insulin use (HCC)  E08.40     Plan:   -Recommended going to lymphedema therapy - Patient has failed 4 weeks of conservative therapy including exercise, elevation, compression stockings-  -  will order a venous compression pump - rx written for diabetic shoes and the patients wife said she would like to call  and find a orthotic lab that would fill the prescription with his insurance - he will return in 4 weeks for follow up.

## 2019-12-28 ENCOUNTER — Telehealth: Payer: Self-pay | Admitting: *Deleted

## 2019-12-28 DIAGNOSIS — R600 Localized edema: Secondary | ICD-10-CM

## 2019-12-28 DIAGNOSIS — I890 Lymphedema, not elsewhere classified: Secondary | ICD-10-CM

## 2019-12-28 DIAGNOSIS — E0840 Diabetes mellitus due to underlying condition with diabetic neuropathy, unspecified: Secondary | ICD-10-CM

## 2019-12-28 NOTE — Telephone Encounter (Signed)
-----   Message from Delories Heinz, DPM sent at 12/27/2019  5:49 PM EDT ----- Philip Holt!I wanted to order a venous compression pump for this patient-  I have no clue what needs to be done on my end, so please let me know what I have left off.Thanks so much!! Dr. Bea Laura

## 2019-12-28 NOTE — Telephone Encounter (Signed)
Faxed cover sheet with rx for B/L lymphedema pumps, clinicals x2 and demographics to [facility].

## 2020-01-03 ENCOUNTER — Encounter (HOSPITAL_COMMUNITY): Payer: Self-pay | Admitting: Physical Therapy

## 2020-01-03 ENCOUNTER — Ambulatory Visit (HOSPITAL_COMMUNITY): Payer: Medicare Other | Attending: Podiatrist | Admitting: Physical Therapy

## 2020-01-03 ENCOUNTER — Other Ambulatory Visit: Payer: Self-pay

## 2020-01-03 DIAGNOSIS — I890 Lymphedema, not elsewhere classified: Secondary | ICD-10-CM | POA: Diagnosis not present

## 2020-01-03 NOTE — Therapy (Signed)
[facility] [address] [place], [place], [address] Phone: [phone]   Fax:  [phone]  Physical Therapy Evaluation  Patient Details  Name: Philip Holt MRN: [id number] Date of Birth: 11/16/1950 Referring Provider (PT): Cleopatra Cedar    Encounter Date: 01/03/2020   PT End of Session - 01/03/20 1123    Visit Number 1    Number of Visits 8    Date for PT Re-Evaluation 02/02/20    Authorization Type Medicare    Progress Note Due on Visit 8    PT Start Time 1000    PT Stop Time 1120    PT Time Calculation (min) 80 min    Activity Tolerance Patient tolerated treatment well    Behavior During Therapy [facility] for tasks assessed/performed           Past Medical History:  Diagnosis Date  . ADD (attention deficit disorder)    evaluated by psych in the past  . Chicken pox   . Diabetes mellitus type II, uncontrolled (HCC)   . Diabetic retinopathy    Dr Robby Sermon proliferative  . Erectile dysfunction   . Hyperlipidemia   . Hypertension   . Non-compliance with treatment     Past Surgical History:  Procedure Laterality Date  . PENILE PROSTHESIS IMPLANT    . WISDOM TOOTH EXTRACTION      There were no vitals filed for this visit.    Subjective Assessment - 01/03/20 1006    Subjective Philip Holt states that he has had increased swelling in both of his LE for years.  He had arterial studies              [facility] PT Assessment - 01/03/20 0001      Assessment   Medical Diagnosis B LE lymphedema    Referring Provider (PT) Cleopatra Cedar     Onset Date/Surgical Date --   years   Prior Therapy none      Precautions   Precautions --   cellulitis      Restrictions   Weight Bearing Restrictions No      Balance Screen   Has the patient fallen in the past 6 months No    Has the patient had a decrease in activity level because of a fear of falling?  Yes    Is the patient reluctant to leave their home because of a fear of  falling?  No      Prior Function   Level of Independence Independent    Vocation Full time employment    Vocation Requirements owns [employer]             LYMPHEDEMA/ONCOLOGY QUESTIONNAIRE - 01/03/20 0001      What other symptoms do you have   Are you Having Heaviness or Tightness Yes    Are you having Pain Yes   3-5 depending    Are you having pitting edema Yes    Body Site LE    Is it Hard or Difficult finding clothes that fit No    Do you have infections No      Lymphedema Stage   Stage STAGE 2 SPONTANEOUSLY IRREVERSIBLE      Lymphedema Assessments   Lymphedema Assessments Lower extremities      Right Lower Extremity Lymphedema   30 cm Proximal to Floor at Lateral Plantar Foot 34 cm    20 cm Proximal to Floor at Lateral Plantar Foot 29.7 1    10  cm  Proximal to Floor at Lateral Malleoli 26.4 cm    Circumference of ankle/heel 35.2 cm.    5 cm Proximal to 1st MTP Joint 26.8 cm    Across MTP Joint 26.8 cm      Left Lower Extremity Lymphedema   30 cm Proximal to Floor at Lateral Plantar Foot 34.8 cm    20 cm Proximal to Floor at Lateral Plantar Foot 30.8 cm    10 cm Proximal to Floor at Lateral Malleoli 26.2 cm    Circumference of ankle/heel 35.8 cm.    5 cm Proximal to 1st MTP Joint 26.5 cm    Across MTP Joint 26.6 cm                   Objective measurements completed on examination: See above findings.               PT Education - 01/03/20 1122    Education Details What lymphedema is and how it is treated, the fact that he has PAD and therefore can not have compression bandaging, LE exercises and self massage    Person(s) Educated Patient;Spouse    Methods Explanation;Demonstration;Tactile cues;Verbal cues;Handout    Comprehension Verbalized understanding;Returned demonstration;Verbal cues required;Tactile cues required;Need further instruction            PT Short Term Goals - 01/03/20 1132      PT SHORT TERM GOAL #1   Title Pt to be  I in LE exercises to improve lymphatic circulation and assist in decreasing edema    Time 2    Period Weeks    Status New    Target Date 01/17/20      PT SHORT TERM GOAL #2   Title Pt to be I in self massage to increase lymphatic circulation and assist in decreasing edema    Time 2    Period Weeks    Status New             PT Long Term Goals - 01/03/20 1133      PT LONG TERM GOAL #1   Title PT to have obtained and be using his pump on a daily basis.    Time 4    Period Weeks    Status New    Target Date 01/31/20      PT LONG TERM GOAL #2   Title PT volumes to have decreased by 2 cm to allow pt to be able to don his shoes.    Time 4    Period Weeks    Status New      PT LONG TERM GOAL #3   Title Pt to have had no pain in the past week in his LE    Time 4    Period Weeks    Status New                  Plan - 01/03/20 1124    Clinical Impression Statement Philip Holt is a 69 yo who has had swelling in his feet for years.  It has gotten to the point where he can not wear any shoes except for his flip flops.  He has had arterial studies which show moderate PAD in RT and mild-moderate in LT.  He tried compression garments but they were to painful for him to wear therefore he is being referred to SKilled PT for lymphedma treatment.  At this time, due to his PAD, I would not recommend compression bandaging.  I have  instructed him in exercises and self massage.  He will benefit from a compression pump as well.  If his swelling is not controlled by this we may have him try 15-20 mm HG instead of the 20-30 and see how he tolerates this.  Philip Holt will benefit from skilled PT for manual lymphatic decongestion techniqes and LE exercises.    Personal Factors and Comorbidities Comorbidity 2;Profession    Comorbidities DM, PAD    Examination-Activity Limitations Locomotion Level;Dressing    Examination-Participation Restrictions Occupation    Stability/Clinical Decision Making  Evolving/Moderate complexity    Clinical Decision Making Moderate    Rehab Potential Fair    PT Frequency 2x / week    PT Duration 4 weeks    PT Treatment/Interventions Therapeutic exercise;Patient/family education;Manual lymph drainage;Manual techniques    PT Next Visit Plan answer any questions complete manual techniques measure once a week    PT Home Exercise Plan HEP, self massage.           Patient will benefit from skilled therapeutic intervention in order to improve the following deficits and impairments:  Difficulty walking, Increased edema, Pain  Visit Diagnosis: Lymphedema, not elsewhere classified     Problem List Patient Active Problem List   Diagnosis Date Noted  . Diabetes mellitus with ophthalmic manifestations 12/27/2013  . Visit for preventive health examination 06/11/2013  . GERD (gastroesophageal reflux disease) 06/11/2013  . ADJUSTMENT DISORDER 09/27/2009  . DIABETIC RETINOPATHY, BACKGROUND 06/05/2009  . Diabetes mellitus type II, uncontrolled (HCC) 01/12/2008  . ONYCHOMYCOSIS, TOENAILS 01/11/2008  . HYPERLIPIDEMIA 11/08/2007  . ERECTILE DYSFUNCTION 11/08/2007  . HYPERTENSION 11/08/2007    Virgina Organ, PT CLT [phone] 01/03/2020, 11:36 AM  [facility] [address] [place], [place], [address] Phone: [phone]   Fax:  [phone]  Name: Philip Holt MRN: [id number] Date of Birth: [birth year]

## 2020-01-05 ENCOUNTER — Other Ambulatory Visit: Payer: Self-pay

## 2020-01-05 ENCOUNTER — Ambulatory Visit (HOSPITAL_COMMUNITY): Payer: Medicare Other | Admitting: Physical Therapy

## 2020-01-05 ENCOUNTER — Encounter (HOSPITAL_COMMUNITY): Payer: Self-pay | Admitting: Physical Therapy

## 2020-01-05 DIAGNOSIS — I890 Lymphedema, not elsewhere classified: Secondary | ICD-10-CM

## 2020-01-05 NOTE — Therapy (Signed)
[facility] [address] [place], [place], [address] Phone: [phone]   Fax:  [phone]  Physical Therapy Treatment  Patient Details  Name: Philip Holt MRN: [id number] Date of Birth: 01-13-1951 Referring Provider (PT): Cleopatra Cedar    Encounter Date: 01/05/2020   PT End of Session - 01/05/20 1044    Visit Number 2    Number of Visits 8    Date for PT Re-Evaluation 02/02/20    Authorization Type Medicare    Progress Note Due on Visit 8    PT Start Time 0955    PT Stop Time 1040    PT Time Calculation (min) 45 min    Activity Tolerance Patient tolerated treatment well    Behavior During Therapy [facility] for tasks assessed/performed           Past Medical History:  Diagnosis Date  . ADD (attention deficit disorder)    evaluated by psych in the past  . Chicken pox   . Diabetes mellitus type II, uncontrolled (HCC)   . Diabetic retinopathy    Dr Robby Sermon proliferative  . Erectile dysfunction   . Hyperlipidemia   . Hypertension   . Non-compliance with treatment     Past Surgical History:  Procedure Laterality Date  . PENILE PROSTHESIS IMPLANT    . WISDOM TOOTH EXTRACTION      There were no vitals filed for this visit.   Subjective Assessment - 01/05/20 1041    Subjective Pt states that he did the ankle pumps but not the rest of the exercises.   He hasn't really tried the self manual because his wife has not been available    Currently in Pain? No/denies                     [facility] Adult PT Treatment/Exercise - 01/05/20 0001      Manual Therapy   Manual Therapy Manual Lymphatic Drainage (MLD)    Manual Lymphatic Drainage (MLD) To include supraclavicar, deep and superfical abdominal, inguinal -axillary anastomosis and LE completed B and both anterior and posteriorly                    PT Short Term Goals - 01/05/20 1046      PT SHORT TERM GOAL #1   Title Pt to be I in LE exercises to improve  lymphatic circulation and assist in decreasing edema    Time 2    Period Weeks    Status On-going    Target Date 01/17/20      PT SHORT TERM GOAL #2   Title Pt to be I in self massage to increase lymphatic circulation and assist in decreasing edema    Time 2    Period Weeks    Status On-going             PT Long Term Goals - 01/05/20 1046      PT LONG TERM GOAL #1   Title PT to have obtained and be using his pump on a daily basis.    Time 4    Period Weeks    Status On-going      PT LONG TERM GOAL #2   Title PT volumes to have decreased by 2 cm to allow pt to be able to don his shoes.    Time 4    Period Weeks    Status On-going      PT LONG TERM GOAL #3  Title Pt to have had no pain in the past week in his LE    Time 4    Period Weeks    Status On-going                 Plan - 01/05/20 1044    Clinical Impression Statement Pt encouraged to complete LE exercises and manual at least daily . Therapist urged pt not to wait for his wife but to complete the self manual on his own.    Personal Factors and Comorbidities Comorbidity 2;Profession    Comorbidities DM, PAD    Examination-Activity Limitations Locomotion Level;Dressing    Examination-Participation Restrictions Occupation    Stability/Clinical Decision Making Evolving/Moderate complexity    Rehab Potential Fair    PT Frequency 2x / week    PT Duration 4 weeks    PT Treatment/Interventions Therapeutic exercise;Patient/family education;Manual lymph drainage;Manual techniques    PT Next Visit Plan continue manual decongestive techniques.  Measure at end of week.    PT Home Exercise Plan HEP, self massage.           Patient will benefit from skilled therapeutic intervention in order to improve the following deficits and impairments:  Difficulty walking, Increased edema, Pain  Visit Diagnosis: Lymphedema, not elsewhere classified     Problem List Patient Active Problem List   Diagnosis Date  Noted  . Diabetes mellitus with ophthalmic manifestations 12/27/2013  . Visit for preventive health examination 06/11/2013  . GERD (gastroesophageal reflux disease) 06/11/2013  . ADJUSTMENT DISORDER 09/27/2009  . DIABETIC RETINOPATHY, BACKGROUND 06/05/2009  . Diabetes mellitus type II, uncontrolled (HCC) 01/12/2008  . ONYCHOMYCOSIS, TOENAILS 01/11/2008  . HYPERLIPIDEMIA 11/08/2007  . ERECTILE DYSFUNCTION 11/08/2007  . HYPERTENSION 11/08/2007    Virgina Organ, PT CLT [phone] 01/05/2020, 10:47 AM  [place] [facility] [address] [place], [place], [address] Phone: [phone]   Fax:  [phone]  Name: Philip Holt MRN: [id number] Date of Birth: Oct 15, 1950

## 2020-01-08 ENCOUNTER — Telehealth: Payer: Self-pay | Admitting: Podiatry

## 2020-01-08 ENCOUNTER — Other Ambulatory Visit: Payer: Self-pay

## 2020-01-08 ENCOUNTER — Ambulatory Visit (HOSPITAL_COMMUNITY): Payer: Medicare Other | Admitting: Physical Therapy

## 2020-01-08 DIAGNOSIS — I890 Lymphedema, not elsewhere classified: Secondary | ICD-10-CM | POA: Diagnosis not present

## 2020-01-08 NOTE — Telephone Encounter (Signed)
Shanda Bumps from [employer] called needing more information to get pt a limphodema pump they received the notes from 12/27/19 but not enough information contact Shanda Bumps [phone] ext [phone]

## 2020-01-08 NOTE — Telephone Encounter (Signed)
I spoke [employer] - Shanda Bumps states they need date of service 12/27/2019 to have in the notes, "pt has failed compression, elevation, exercise for 4 weeks" and description of the skin of the legs, fax to [phone].

## 2020-01-08 NOTE — Therapy (Signed)
[facility] [address] [place], [place], [address] Phone: [phone]   Fax:  [phone]  Physical Therapy Treatment  Patient Details  Name: Philip Holt MRN: [id number] Date of Birth: 05-20-1951 Referring Provider (PT): Cleopatra Cedar    Encounter Date: 01/08/2020   PT End of Session - 01/08/20 1404    Visit Number 3    Number of Visits 8    Date for PT Re-Evaluation 02/02/20    Authorization Type Medicare    Authorization - Visit Number 1319    Authorization - Number of Visits 1400    Progress Note Due on Visit 8    Activity Tolerance Patient tolerated treatment well    Behavior During Therapy [facility] for tasks assessed/performed           Past Medical History:  Diagnosis Date  . ADD (attention deficit disorder)    evaluated by psych in the past  . Chicken pox   . Diabetes mellitus type II, uncontrolled (HCC)   . Diabetic retinopathy    Dr Robby Sermon proliferative  . Erectile dysfunction   . Hyperlipidemia   . Hypertension   . Non-compliance with treatment     Past Surgical History:  Procedure Laterality Date  . PENILE PROSTHESIS IMPLANT    . WISDOM TOOTH EXTRACTION      There were no vitals filed for this visit.   Subjective Assessment - 01/08/20 1400    Subjective Pt states his pump is suppose to come this friday.  States he sleeps with his legs elevated.  Reports he still has not completed self manual or had his wife to do it.    Currently in Pain? No/denies                             [facility] Adult PT Treatment/Exercise - 01/08/20 0001      Manual Therapy   Manual Therapy Manual Lymphatic Drainage (MLD)    Manual therapy comments completed for bilateral LE's    Manual Lymphatic Drainage (MLD) To include supraclavicar, deep and superfical abdominal, inguinal -axillary anastomosis and LE completed B and both anterior and posteriorly                  PT Education - 01/08/20 1405     Education Details to get order from MD for diabetic shoes.  REcommended for safety and compression    Person(s) Educated Patient    Methods Explanation    Comprehension Verbalized understanding            PT Short Term Goals - 01/05/20 1046      PT SHORT TERM GOAL #1   Title Pt to be I in LE exercises to improve lymphatic circulation and assist in decreasing edema    Time 2    Period Weeks    Status On-going    Target Date 01/17/20      PT SHORT TERM GOAL #2   Title Pt to be I in self massage to increase lymphatic circulation and assist in decreasing edema    Time 2    Period Weeks    Status On-going             PT Long Term Goals - 01/05/20 1046      PT LONG TERM GOAL #1   Title PT to have obtained and be using his pump on a daily basis.    Time 4  Period Weeks    Status On-going      PT LONG TERM GOAL #2   Title PT volumes to have decreased by 2 cm to allow pt to be able to don his shoes.    Time 4    Period Weeks    Status On-going      PT LONG TERM GOAL #3   Title Pt to have had no pain in the past week in his LE    Time 4    Period Weeks    Status On-going                 Plan - 01/08/20 1401    Clinical Impression Statement Manual lymph drainage completed for bil LE's anteriorly and posteriorly.   Encouraged to continue LE exercises and complete self manual daily. Focused most manual in ankles and feet.  Pitting edema in dorsal feet reduced 80% with manual techniques.  ENcouarged pt to get diabetic shoes (informed insurance should cover these 1X/year with MD script) rather than flip flops as they are a fall risk and does not assit in compression for feet.    Personal Factors and Comorbidities Comorbidity 2;Profession    Comorbidities DM, PAD    Examination-Activity Limitations Locomotion Level;Dressing    Examination-Participation Restrictions Occupation    Stability/Clinical Decision Making Evolving/Moderate complexity    Rehab Potential Fair     PT Frequency 2x / week    PT Duration 4 weeks    PT Treatment/Interventions Therapeutic exercise;Patient/family education;Manual lymph drainage;Manual techniques    PT Next Visit Plan continue manual decongestive techniques.  Measure at end of week.    PT Home Exercise Plan HEP, self massage.           Patient will benefit from skilled therapeutic intervention in order to improve the following deficits and impairments:  Difficulty walking, Increased edema, Pain  Visit Diagnosis: Lymphedema, not elsewhere classified     Problem List Patient Active Problem List   Diagnosis Date Noted  . Diabetes mellitus with ophthalmic manifestations 12/27/2013  . Visit for preventive health examination 06/11/2013  . GERD (gastroesophageal reflux disease) 06/11/2013  . ADJUSTMENT DISORDER 09/27/2009  . DIABETIC RETINOPATHY, BACKGROUND 06/05/2009  . Diabetes mellitus type II, uncontrolled (HCC) 01/12/2008  . ONYCHOMYCOSIS, TOENAILS 01/11/2008  . HYPERLIPIDEMIA 11/08/2007  . ERECTILE DYSFUNCTION 11/08/2007  . HYPERTENSION 11/08/2007   Lurena Nida, PTA/CLT [phone]  Lurena Nida 01/08/2020, 2:06 PM  [facility] [address] [place], [place], [address] Phone: [phone]   Fax:  [phone]  Name: Philip Holt MRN: [id number] Date of Birth: 02/09/1951

## 2020-01-10 ENCOUNTER — Telehealth (HOSPITAL_COMMUNITY): Payer: Self-pay | Admitting: Physical Therapy

## 2020-01-10 ENCOUNTER — Ambulatory Visit (HOSPITAL_COMMUNITY): Payer: Medicare Other | Admitting: Physical Therapy

## 2020-01-10 NOTE — Telephone Encounter (Signed)
   Called pt RE no show #1.  Wife states that they forgot about the appointment.  Therapist reminded family that the next appointment was Monday at 10:00.   Virgina Organ, PT CLT [phone]

## 2020-01-11 ENCOUNTER — Telehealth: Payer: Self-pay | Admitting: Podiatrist

## 2020-01-11 NOTE — Telephone Encounter (Signed)
I had a message on my voicemail for Philip Holt from [place], number [phone] ext [phone](Brittany), she needs information faxed over to [phone] about patients (Exercise, Elevation and skin changes.

## 2020-01-11 NOTE — Telephone Encounter (Signed)
Faxed Dr. Faylene Million addended clinicals.

## 2020-01-12 ENCOUNTER — Encounter (HOSPITAL_COMMUNITY): Payer: Medicare Other

## 2020-01-15 ENCOUNTER — Other Ambulatory Visit: Payer: Self-pay

## 2020-01-15 ENCOUNTER — Ambulatory Visit (HOSPITAL_COMMUNITY): Payer: Medicare Other | Admitting: Physical Therapy

## 2020-01-15 DIAGNOSIS — I890 Lymphedema, not elsewhere classified: Secondary | ICD-10-CM

## 2020-01-15 NOTE — Therapy (Signed)
[facility] [address] [place], [place], [address] Phone: [phone]   Fax:  [phone]  Physical Therapy Treatment  Patient Details  Name: Philip Holt MRN: [id number] Date of Birth: 08/27/1950 Referring Provider (PT): Cleopatra Cedar    Encounter Date: 01/15/2020   PT End of Session - 01/15/20 1242    Visit Number 4    Number of Visits 8    Date for PT Re-Evaluation 02/02/20    Authorization Type Medicare    Progress Note Due on Visit 8    PT Start Time 1005    PT Stop Time 1052    PT Time Calculation (min) 47 min    Activity Tolerance Patient tolerated treatment well    Behavior During Therapy [facility] for tasks assessed/performed           Past Medical History:  Diagnosis Date  . ADD (attention deficit disorder)    evaluated by psych in the past  . Chicken pox   . Diabetes mellitus type II, uncontrolled (HCC)   . Diabetic retinopathy    Dr Robby Sermon proliferative  . Erectile dysfunction   . Hyperlipidemia   . Hypertension   . Non-compliance with treatment     Past Surgical History:  Procedure Laterality Date  . PENILE PROSTHESIS IMPLANT    . WISDOM TOOTH EXTRACTION      There were no vitals filed for this visit.   Subjective Assessment - 01/15/20 1240    Subjective pt states a man came to show him how to use the pump but does not actually have it yet.  STates is has been shipped and should get it soon.  STates he is unable to complete self massage to his feet as it is too difficutl to get down to his feet.    Currently in Pain? No/denies                             [facility] Adult PT Treatment/Exercise - 01/15/20 0001      Manual Therapy   Manual Therapy Manual Lymphatic Drainage (MLD)    Manual therapy comments completed for bilateral LE's    Manual Lymphatic Drainage (MLD) To include supraclavicar, deep and superfical abdominal, inguinal -axillary anastomosis and LE completed B and both  anterior and posteriorly                    PT Short Term Goals - 01/05/20 1046      PT SHORT TERM GOAL #1   Title Pt to be I in LE exercises to improve lymphatic circulation and assist in decreasing edema    Time 2    Period Weeks    Status On-going    Target Date 01/17/20      PT SHORT TERM GOAL #2   Title Pt to be I in self massage to increase lymphatic circulation and assist in decreasing edema    Time 2    Period Weeks    Status On-going             PT Long Term Goals - 01/05/20 1046      PT LONG TERM GOAL #1   Title PT to have obtained and be using his pump on a daily basis.    Time 4    Period Weeks    Status On-going      PT LONG TERM GOAL #2   Title PT volumes  to have decreased by 2 cm to allow pt to be able to don his shoes.    Time 4    Period Weeks    Status On-going      PT LONG TERM GOAL #3   Title Pt to have had no pain in the past week in his LE    Time 4    Period Weeks    Status On-going                 Plan - 01/15/20 1243    Clinical Impression Statement continued with MLD for bilateral LE both anteriorly and posteriorly.  More time spent around bil ankles and feet as these are basically the only edematous areas at this time.  Reminded about diabetic shoes once again and to inform therapist when he gets his pump.    Personal Factors and Comorbidities Comorbidity 2;Profession    Comorbidities DM, PAD    Examination-Activity Limitations Locomotion Level;Dressing    Examination-Participation Restrictions Occupation    Stability/Clinical Decision Making Evolving/Moderate complexity    Rehab Potential Fair    PT Frequency 2x / week    PT Duration 4 weeks    PT Treatment/Interventions Therapeutic exercise;Patient/family education;Manual lymph drainage;Manual techniques    PT Next Visit Plan continue manual decongestive techniques.  Measure at end of week.  F/U on pump and if pt obtained an order for diabetic shoes from MD.    PT  Home Exercise Plan HEP, self massage.           Patient will benefit from skilled therapeutic intervention in order to improve the following deficits and impairments:  Difficulty walking, Increased edema, Pain  Visit Diagnosis: Lymphedema, not elsewhere classified     Problem List Patient Active Problem List   Diagnosis Date Noted  . Diabetes mellitus with ophthalmic manifestations 12/27/2013  . Visit for preventive health examination 06/11/2013  . GERD (gastroesophageal reflux disease) 06/11/2013  . ADJUSTMENT DISORDER 09/27/2009  . DIABETIC RETINOPATHY, BACKGROUND 06/05/2009  . Diabetes mellitus type II, uncontrolled (HCC) 01/12/2008  . ONYCHOMYCOSIS, TOENAILS 01/11/2008  . HYPERLIPIDEMIA 11/08/2007  . ERECTILE DYSFUNCTION 11/08/2007  . HYPERTENSION 11/08/2007   Lurena Nida, PTA/CLT [phone]  Lurena Nida 01/15/2020, 12:45 PM  [facility] [address] [place], [place], [address] Phone: [phone]   Fax:  [phone]  Name: Philip Holt MRN: [id number] Date of Birth: 1951/05/18

## 2020-01-16 DIAGNOSIS — Z7984 Long term (current) use of oral hypoglycemic drugs: Secondary | ICD-10-CM | POA: Diagnosis not present

## 2020-01-16 DIAGNOSIS — E1165 Type 2 diabetes mellitus with hyperglycemia: Secondary | ICD-10-CM | POA: Diagnosis not present

## 2020-01-16 DIAGNOSIS — E1140 Type 2 diabetes mellitus with diabetic neuropathy, unspecified: Secondary | ICD-10-CM | POA: Diagnosis not present

## 2020-01-16 DIAGNOSIS — Z794 Long term (current) use of insulin: Secondary | ICD-10-CM | POA: Diagnosis not present

## 2020-01-16 DIAGNOSIS — R600 Localized edema: Secondary | ICD-10-CM | POA: Diagnosis not present

## 2020-01-16 DIAGNOSIS — I890 Lymphedema, not elsewhere classified: Secondary | ICD-10-CM | POA: Diagnosis not present

## 2020-01-16 DIAGNOSIS — Z79899 Other long term (current) drug therapy: Secondary | ICD-10-CM | POA: Diagnosis not present

## 2020-01-17 ENCOUNTER — Ambulatory Visit (HOSPITAL_COMMUNITY): Payer: Medicare Other | Admitting: Physical Therapy

## 2020-01-17 ENCOUNTER — Telehealth (HOSPITAL_COMMUNITY): Payer: Self-pay | Admitting: Physical Therapy

## 2020-01-17 NOTE — Telephone Encounter (Signed)
Pt did not show for appointment this session.  Called and spoke to spouse who states they were not aware of today's appt as they always come on Monday and Fridays.  Reminded of appt on Monday.  Lurena Nida, PTA/CLT [phone]

## 2020-01-19 ENCOUNTER — Encounter (HOSPITAL_COMMUNITY): Payer: Medicare Other

## 2020-01-22 ENCOUNTER — Other Ambulatory Visit: Payer: Self-pay

## 2020-01-22 ENCOUNTER — Ambulatory Visit (HOSPITAL_COMMUNITY): Payer: Medicare Other | Admitting: Physical Therapy

## 2020-01-22 DIAGNOSIS — I890 Lymphedema, not elsewhere classified: Secondary | ICD-10-CM | POA: Diagnosis not present

## 2020-01-22 NOTE — Therapy (Signed)
[facility] [address] [place], [place], [address] Phone: [phone]   Fax:  [phone]  Physical Therapy Treatment  Patient Details  Name: Philip Holt MRN: [id number] Date of Birth: 08-21-1950 Referring Provider (PT): Cleopatra Cedar    Encounter Date: 01/22/2020   PT End of Session - 01/22/20 1059    Visit Number 5    Number of Visits 8    Date for PT Re-Evaluation 02/02/20    Authorization Type Medicare    Progress Note Due on Visit 8    PT Start Time 1010    PT Stop Time 1049    PT Time Calculation (min) 39 min    Activity Tolerance Patient tolerated treatment well    Behavior During Therapy [facility] for tasks assessed/performed           Past Medical History:  Diagnosis Date  . ADD (attention deficit disorder)    evaluated by psych in the past  . Chicken pox   . Diabetes mellitus type II, uncontrolled (HCC)   . Diabetic retinopathy    Dr Robby Sermon proliferative  . Erectile dysfunction   . Hyperlipidemia   . Hypertension   . Non-compliance with treatment     Past Surgical History:  Procedure Laterality Date  . PENILE PROSTHESIS IMPLANT    . WISDOM TOOTH EXTRACTION      There were no vitals filed for this visit.   Subjective Assessment - 01/22/20 1055    Subjective pt states he got an order for diabetic shoes and is waiting on his insurance to approve it.  States he still has not received his pump.    Currently in Pain? No/denies                 LYMPHEDEMA/ONCOLOGY QUESTIONNAIRE - 01/22/20 1056      What other symptoms do you have   Are you Having Heaviness or Tightness Yes    Are you having Pain Yes    Are you having pitting edema Yes    Body Site LE    Is it Hard or Difficult finding clothes that fit No    Do you have infections No      Lymphedema Stage   Stage STAGE 2 SPONTANEOUSLY IRREVERSIBLE      Lymphedema Assessments   Lymphedema Assessments Lower extremities      Right Lower  Extremity Lymphedema   30 cm Proximal to Floor at Lateral Plantar Foot 33 cm   was 34 on 8/4   20 cm Proximal to Floor at Lateral Plantar Foot 29 1   was 29.7   10 cm Proximal to Floor at Lateral Malleoli 25.5 cm   was 26.4   Circumference of ankle/heel 34.5 cm.   was 35.2   5 cm Proximal to 1st MTP Joint 25.5 cm   was 26.8   Across MTP Joint 26.5 cm   was 26.8     Left Lower Extremity Lymphedema   30 cm Proximal to Floor at Lateral Plantar Foot 33 cm   was 34.8   20 cm Proximal to Floor at Lateral Plantar Foot 30 cm   was 30.8   10 cm Proximal to Floor at Lateral Malleoli 25.5 cm   was 26.2   Circumference of ankle/heel 34.5 cm.   was 35.8   5 cm Proximal to 1st MTP Joint 25.5 cm   was 26.5   Across MTP Joint 26 cm   was 26.6  PT Short Term Goals - 01/22/20 1058      PT SHORT TERM GOAL #1   Title Pt to be I in LE exercises to improve lymphatic circulation and assist in decreasing edema    Time 2    Period Weeks    Status On-going    Target Date 01/17/20      PT SHORT TERM GOAL #2   Title Pt to be I in self massage to increase lymphatic circulation and assist in decreasing edema    Time 2    Period Weeks    Status On-going             PT Long Term Goals - 01/05/20 1046      PT LONG TERM GOAL #1   Title PT to have obtained and be using his pump on a daily basis.    Time 4    Period Weeks    Status On-going      PT LONG TERM GOAL #2   Title PT volumes to have decreased by 2 cm to allow pt to be able to don his shoes.    Time 4    Period Weeks    Status On-going      PT LONG TERM GOAL #3   Title Pt to have had no pain in the past week in his LE    Time 4    Period Weeks    Status On-going                 Plan - 01/22/20 1059    Clinical Impression Statement Pt remeasured today with approx .6-1 cm loss in most every area.  Pt continues to have difficulty with self massage due to inability to get to feet  well, however is doing his exercises as instructed.  Pt still awaiting his pump and diabetic shoes.  Pitting edema present in dorsum of feet and ankles with only minimal induration present.  Overall improving.    Personal Factors and Comorbidities Comorbidity 2;Profession    Comorbidities DM, PAD    Examination-Activity Limitations Locomotion Level;Dressing    Examination-Participation Restrictions Occupation    Stability/Clinical Decision Making Evolving/Moderate complexity    Rehab Potential Fair    PT Frequency 2x / week    PT Duration 4 weeks    PT Treatment/Interventions Therapeutic exercise;Patient/family education;Manual lymph drainage;Manual techniques    PT Next Visit Plan continue manual decongestive techniques.  Measure at end of week.  F/U on pump and if pt obtained an order for diabetic shoes.    PT Home Exercise Plan HEP, self massage.           Patient will benefit from skilled therapeutic intervention in order to improve the following deficits and impairments:  Difficulty walking, Increased edema, Pain  Visit Diagnosis: Lymphedema, not elsewhere classified     Problem List Patient Active Problem List   Diagnosis Date Noted  . Diabetes mellitus with ophthalmic manifestations 12/27/2013  . Visit for preventive health examination 06/11/2013  . GERD (gastroesophageal reflux disease) 06/11/2013  . ADJUSTMENT DISORDER 09/27/2009  . DIABETIC RETINOPATHY, BACKGROUND 06/05/2009  . Diabetes mellitus type II, uncontrolled (HCC) 01/12/2008  . ONYCHOMYCOSIS, TOENAILS 01/11/2008  . HYPERLIPIDEMIA 11/08/2007  . ERECTILE DYSFUNCTION 11/08/2007  . HYPERTENSION 11/08/2007   Lurena Nida, PTA/CLT [phone]  Lurena Nida 01/22/2020, 11:02 AM  [facility] [address] [place], [place], [address] Phone: [phone]   Fax:  [phone]  Name: [facility]  JACKSON COFFIELD MRN: [id number] Date of Birth: Sep 11, 1950

## 2020-01-24 ENCOUNTER — Ambulatory Visit (HOSPITAL_COMMUNITY): Payer: Medicare Other | Admitting: Physical Therapy

## 2020-01-24 ENCOUNTER — Other Ambulatory Visit: Payer: Self-pay

## 2020-01-24 DIAGNOSIS — I890 Lymphedema, not elsewhere classified: Secondary | ICD-10-CM | POA: Diagnosis not present

## 2020-01-24 NOTE — Therapy (Signed)
[facility] [address] [place], [place], [address] Phone: [phone]   Fax:  [phone]  Physical Therapy Treatment  Patient Details  Name: Philip Holt MRN: [id number] Date of Birth: [birth year] Referring Provider (PT): Cleopatra Cedar    Encounter Date: 01/24/2020   PT End of Session - 01/24/20 1058    Visit Number 6    Number of Visits 8    Date for PT Re-Evaluation 02/02/20    Authorization Type Medicare    Progress Note Due on Visit 8    PT Start Time 1008    PT Stop Time 1050    PT Time Calculation (min) 42 min    Activity Tolerance Patient tolerated treatment well    Behavior During Therapy [facility] for tasks assessed/performed           Past Medical History:  Diagnosis Date  . ADD (attention deficit disorder)    evaluated by psych in the past  . Chicken pox   . Diabetes mellitus type II, uncontrolled (HCC)   . Diabetic retinopathy    Dr Robby Sermon proliferative  . Erectile dysfunction   . Hyperlipidemia   . Hypertension   . Non-compliance with treatment     Past Surgical History:  Procedure Laterality Date  . PENILE PROSTHESIS IMPLANT    . WISDOM TOOTH EXTRACTION      There were no vitals filed for this visit.   Subjective Assessment - 01/24/20 1057    Subjective pt states his is going to call the pump rep and his MD regarding his shoes today when he gets home.  No issues currently or pain.    Currently in Pain? No/denies                             [facility] Adult PT Treatment/Exercise - 01/24/20 0001      Manual Therapy   Manual Therapy Manual Lymphatic Drainage (MLD)    Manual therapy comments completed for bilateral LE's    Manual Lymphatic Drainage (MLD) To include supraclavicar, deep and superfical abdominal, inguinal -axillary anastomosis and LE completed B and both anterior and posteriorly                    PT Short Term Goals - 01/22/20 1058      PT SHORT TERM GOAL #1    Title Pt to be I in LE exercises to improve lymphatic circulation and assist in decreasing edema    Time 2    Period Weeks    Status On-going    Target Date 01/17/20      PT SHORT TERM GOAL #2   Title Pt to be I in self massage to increase lymphatic circulation and assist in decreasing edema    Time 2    Period Weeks    Status On-going             PT Long Term Goals - 01/05/20 1046      PT LONG TERM GOAL #1   Title PT to have obtained and be using his pump on a daily basis.    Time 4    Period Weeks    Status On-going      PT LONG TERM GOAL #2   Title PT volumes to have decreased by 2 cm to allow pt to be able to don his shoes.    Time 4    Period Weeks  Status On-going      PT LONG TERM GOAL #3   Title Pt to have had no pain in the past week in his LE    Time 4    Period Weeks    Status On-going                 Plan - 01/24/20 1100    Clinical Impression Statement continued with manual lymph drainage for bilateral LE's today.  Noted less induration and edema into distal LE's today.    Personal Factors and Comorbidities Comorbidity 2;Profession    Comorbidities DM, PAD    Examination-Activity Limitations Locomotion Level;Dressing    Examination-Participation Restrictions Occupation    Stability/Clinical Decision Making Evolving/Moderate complexity    Rehab Potential Fair    PT Frequency 2x / week    PT Duration 4 weeks    PT Treatment/Interventions Therapeutic exercise;Patient/family education;Manual lymph drainage;Manual techniques    PT Next Visit Plan continue manual decongestive techniques.  Measure each week.  F/U on pump and if pt obtained an order for diabetic shoes.    PT Home Exercise Plan HEP, self massage.           Patient will benefit from skilled therapeutic intervention in order to improve the following deficits and impairments:  Difficulty walking, Increased edema, Pain  Visit Diagnosis: Lymphedema, not elsewhere  classified     Problem List Patient Active Problem List   Diagnosis Date Noted  . Diabetes mellitus with ophthalmic manifestations 12/27/2013  . Visit for preventive health examination 06/11/2013  . GERD (gastroesophageal reflux disease) 06/11/2013  . ADJUSTMENT DISORDER 09/27/2009  . DIABETIC RETINOPATHY, BACKGROUND 06/05/2009  . Diabetes mellitus type II, uncontrolled (HCC) 01/12/2008  . ONYCHOMYCOSIS, TOENAILS 01/11/2008  . HYPERLIPIDEMIA 11/08/2007  . ERECTILE DYSFUNCTION 11/08/2007  . HYPERTENSION 11/08/2007   Lurena Nida, PTA/CLT [phone]  Lurena Nida 01/24/2020, 11:02 AM  [facility] [address] [place], [place], [address] Phone: [phone]   Fax:  [phone]  Name: Philip Holt MRN: [id number] Date of Birth: Oct 24, 1950

## 2020-01-26 ENCOUNTER — Encounter (HOSPITAL_COMMUNITY): Payer: Medicare Other

## 2020-01-29 ENCOUNTER — Ambulatory Visit (HOSPITAL_COMMUNITY): Payer: Medicare Other | Admitting: Physical Therapy

## 2020-01-29 ENCOUNTER — Other Ambulatory Visit: Payer: Self-pay

## 2020-01-29 DIAGNOSIS — I890 Lymphedema, not elsewhere classified: Secondary | ICD-10-CM
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
## 2020-01-31 ENCOUNTER — Telehealth (HOSPITAL_COMMUNITY): Payer: Self-pay | Admitting: Physical Therapy

## 2020-01-31 ENCOUNTER — Encounter (HOSPITAL_COMMUNITY): Payer: Medicare Other | Admitting: Physical Therapy

## 2020-01-31 NOTE — Telephone Encounter (Signed)
PT called re: missed appointment.  Therapist left message that his next appointment will be on Friday at 10:45.  Virgina Organ, PT CLT [phone]

## 2020-02-01 ENCOUNTER — Ambulatory Visit: Payer: Medicare Other | Admitting: Podiatry

## 2020-02-01 DIAGNOSIS — E113511 Type 2 diabetes mellitus with proliferative diabetic retinopathy with macular edema, right eye: Secondary | ICD-10-CM | POA: Diagnosis not present

## 2020-02-02 ENCOUNTER — Encounter (HOSPITAL_COMMUNITY): Payer: Self-pay

## 2020-02-02 ENCOUNTER — Telehealth (HOSPITAL_COMMUNITY): Payer: Self-pay | Admitting: Physical Therapy

## 2020-02-02 ENCOUNTER — Ambulatory Visit (HOSPITAL_COMMUNITY): Payer: Medicare Other | Attending: Podiatrist | Admitting: Physical Therapy

## 2020-02-02 DIAGNOSIS — I890 Lymphedema, not elsewhere classified: Secondary | ICD-10-CM | POA: Insufficient documentation

## 2020-02-02 NOTE — Telephone Encounter (Signed)
2nd NO SHOW:  No answer left message on phone that his pump has been ordered, his next appointment is Tuesday and that all other appointments have been cancelled.   Virgina Organ, PT CLT [phone]

## 2020-02-06 ENCOUNTER — Ambulatory Visit (HOSPITAL_COMMUNITY): Payer: Medicare Other | Admitting: Physical Therapy

## 2020-02-06 ENCOUNTER — Other Ambulatory Visit: Payer: Self-pay

## 2020-02-06 ENCOUNTER — Encounter (HOSPITAL_COMMUNITY): Payer: Self-pay | Admitting: Physical Therapy

## 2020-02-06 DIAGNOSIS — I890 Lymphedema, not elsewhere classified: Secondary | ICD-10-CM | POA: Diagnosis not present

## 2020-02-06 NOTE — Therapy (Deleted)
[facility] [address] [place], [place], [address] Phone: [phone]   Fax:  [phone]  Patient Details  Name: Philip Holt MRN: [id number] Date of Birth: 1950/06/05 Referring Provider:  Wenda Low, MD  Encounter Date: 02/06/2020    PHYSICAL THERAPY DISCHARGE SUMMARY  Visits from Start of Care: 2  Current functional level related to goals / functional outcomes: unknown   Remaining deficits: unknown Education / Equipment: Self massage; compression pump  Plan: Patient agrees to discharge.  Patient goals were not met. Patient is being discharged due to not returning since the last visit.  ?????     Rayetta Humphrey, PT CLT [phone]  02/06/2020, 10:57 AM  [facility], [place], [address] Phone: [phone]   Fax:  [phone]

## 2020-02-06 NOTE — Therapy (Signed)
[place], [place], [address] Phone: [phone]   Fax:  [phone]  Patient Details  Name: Philip Holt MRN: [id number] Date of Birth: [birth year] Referring Provider:  No ref. provider found  Encounter Date: 02/06/2020      PHYSICAL THERAPY DISCHARGE SUMMARY  Visits from Start of Care: 2  Current functional level related to goals / functional outcomes: unknown   Remaining deficits: unknown Education / Equipment: Self massage; compression pump  Plan: Patient agrees to discharge.  Patient goals were not met. Patient is being discharged due to financial reasons.  ?????     Rayetta Humphrey, PT CLT [phone]      02/06/2020, 11:00 AM  [facility] [address] [place], [place], [address] Phone: [phone]   Fax:  [phone]

## 2020-02-06 NOTE — Therapy (Signed)
[facility] [address] [place], [place], [address] Phone: [phone]   Fax:  [phone]  Physical Therapy Treatment  Patient Details  Name: Philip Holt MRN: [id number] Date of Birth: 03/27/1951 Referring Provider (PT): Ardine Bjork   PHYSICAL THERAPY DISCHARGE SUMMARY  Visits from Start of Care: 8  Current functional level related to goals / functional outcomes: See below    Remaining deficits: See below   Education / Equipment: HEP; pump ordered, self massage, use of 15-20 mm hg compression garments.  Plan: Patient agrees to discharge.  Patient goals were not met. Patient is being discharged due to not returning since the last visit.  ?????     Encounter Date: 02/06/2020   PT End of Session - 02/06/20 1227    Visit Number 8    Number of Visits 8    Date for PT Re-Evaluation 02/02/20    Authorization Type Medicare    Progress Note Due on Visit 8    PT Start Time 1105    PT Stop Time 1140    PT Time Calculation (min) 35 min    Activity Tolerance Patient tolerated treatment well    Behavior During Therapy [facility] for tasks assessed/performed           Past Medical History:  Diagnosis Date  . ADD (attention deficit disorder)    evaluated by psych in the past  . Chicken pox   . Diabetes mellitus type II, uncontrolled ([place])   . Diabetic retinopathy    Dr Cyndi Bender proliferative  . Erectile dysfunction   . Hyperlipidemia   . Hypertension   . Non-compliance with treatment     Past Surgical History:  Procedure Laterality Date  . PENILE PROSTHESIS IMPLANT    . WISDOM TOOTH EXTRACTION      There were no vitals filed for this visit.   Subjective Assessment - 02/06/20 1125    Subjective Pt  states that he has been contacted by the compression pump, he has been doing his exercises and self manual as much as he can.                 LYMPHEDEMA/ONCOLOGY QUESTIONNAIRE - 02/06/20 0001      Right Lower  Extremity Lymphedema   30 cm Proximal to Floor at Lateral Plantar Foot 34 cm   was 34   20 cm Proximal to Floor at Lateral Plantar Foot 30 1   was 29.7   10 cm Proximal to Floor at Lateral Malleoli 25.3 cm   was 26.4   Circumference of ankle/heel 35 cm.   35.2   5 cm Proximal to 1st MTP Joint 25.6 cm   was 26.8   Across MTP Joint 25.3 cm   26.8     Left Lower Extremity Lymphedema   30 cm Proximal to Floor at Lateral Plantar Foot 33.3 cm   was 34.8   20 cm Proximal to Floor at Lateral Plantar Foot 30.8 cm   was 30.8   10 cm Proximal to Floor at Lateral Malleoli 26 cm   was 26.2   Circumference of ankle/heel 34.2 cm.   was 35.8   5 cm Proximal to 1st MTP Joint 26 cm   was 26.5   Across MTP Joint 25.8 cm   was 26.6                              PT  Education - 02/06/20 1226    Education Details The importance of completing self manual as well as LE exercises.  That once he recieves the pump he needs to pump everyday.    Person(s) Educated Patient    Methods Explanation;Handout    Comprehension Verbalized understanding            PT Short Term Goals - 02/06/20 1231      PT SHORT TERM GOAL #1   Title Pt to be I in LE exercises to improve lymphatic circulation and assist in decreasing edema    Time 2    Period Weeks    Status Achieved   I but does not do   Target Date 01/17/20      PT SHORT TERM GOAL #2   Title Pt to be I in self massage to increase lymphatic circulation and assist in decreasing edema    Time 2    Period Weeks    Status Achieved   does not do            PT Long Term Goals - 02/06/20 1232      PT LONG TERM GOAL #1   Title PT to have obtained and be using his pump on a daily basis.    Time 4    Period Weeks    Status Not Met   pump should arrive this week.     PT LONG TERM GOAL #2   Title PT volumes to have decreased by 2 cm to allow pt to be able to don his shoes.    Time 4    Period Weeks    Status Not Met   due to non  compliance     PT LONG TERM GOAL #3   Title Pt to have had no pain in the past week in his LE    Time 4    Period Weeks    Status Not Met                 Plan - 02/06/20 1228    Clinical Impression Statement PT  late for appointment.  PT states that he has compression garments but is unable to wear them due to being to tight, states he does not do the self manual due to not being able to get to his legs easily, he does not do the exercise due to time.  PT basically does not want to put effort into decreasing his lymphedema.  Therapis explained that he will be discharged as pt does not show up for appointments and does not call nor is he compliant in self treating for lymphedema.    Personal Factors and Comorbidities Comorbidity 2;Profession    Comorbidities DM, PAD    Examination-Activity Limitations Locomotion Level;Dressing    Examination-Participation Restrictions Occupation    Stability/Clinical Decision Making Evolving/Moderate complexity    Rehab Potential Fair    PT Frequency 2x / week    PT Duration 4 weeks    PT Treatment/Interventions Therapeutic exercise;Patient/family education;Manual lymph drainage;Manual techniques    PT Next Visit Plan Discharge.    PT Home Exercise Plan HEP, self massage.           Patient will benefit from skilled therapeutic intervention in order to improve the following deficits and impairments:  Difficulty walking, Increased edema, Pain  Visit Diagnosis: Lymphedema, not elsewhere classified     Problem List Patient Active Problem List   Diagnosis Date Noted  . Diabetes mellitus with ophthalmic manifestations  12/27/2013  . Visit for preventive health examination 06/11/2013  . GERD (gastroesophageal reflux disease) 06/11/2013  . ADJUSTMENT DISORDER 09/27/2009  . DIABETIC RETINOPATHY, BACKGROUND 06/05/2009  . Diabetes mellitus type II, uncontrolled ([place]) 01/12/2008  . ONYCHOMYCOSIS, TOENAILS 01/11/2008  . HYPERLIPIDEMIA 11/08/2007   . ERECTILE DYSFUNCTION 11/08/2007  . HYPERTENSION 11/08/2007    Rayetta Humphrey, PT CLT [phone] 02/06/2020, 12:35 PM  [facility] [address] [place], [place], [address] Phone: [phone]   Fax:  [phone]  Name: Philip Holt MRN: [id number] Date of Birth: Dec 02, 1950

## 2020-02-08 ENCOUNTER — Encounter (HOSPITAL_COMMUNITY): Payer: Medicare Other | Admitting: Physical Therapy

## 2020-02-09 ENCOUNTER — Encounter (HOSPITAL_COMMUNITY): Payer: Medicare Other

## 2020-02-12 ENCOUNTER — Encounter (HOSPITAL_COMMUNITY): Payer: Medicare Other | Admitting: Physical Therapy

## 2020-02-14 ENCOUNTER — Encounter (HOSPITAL_COMMUNITY): Payer: Medicare Other | Admitting: Physical Therapy

## 2020-02-16 ENCOUNTER — Encounter (HOSPITAL_COMMUNITY): Payer: Medicare Other | Admitting: Physical Therapy

## 2020-02-19 ENCOUNTER — Encounter (HOSPITAL_COMMUNITY): Payer: Medicare Other | Admitting: Physical Therapy

## 2020-02-20 DIAGNOSIS — E1165 Type 2 diabetes mellitus with hyperglycemia: Secondary | ICD-10-CM | POA: Diagnosis not present

## 2020-02-20 DIAGNOSIS — Z7984 Long term (current) use of oral hypoglycemic drugs: Secondary | ICD-10-CM | POA: Diagnosis not present

## 2020-02-21 ENCOUNTER — Encounter (HOSPITAL_COMMUNITY): Payer: Medicare Other | Admitting: Physical Therapy

## 2020-02-22 ENCOUNTER — Ambulatory Visit
Admission: RE | Admit: 2020-02-22 | Discharge: 2020-02-22 | Disposition: A | Discharge status: Home / Self Care | Payer: Medicare Other | Source: Ambulatory Visit | Attending: Internal Medicine | Admitting: Internal Medicine

## 2020-02-22 ENCOUNTER — Other Ambulatory Visit: Payer: Self-pay | Admitting: Internal Medicine

## 2020-02-22 DIAGNOSIS — I700 Atherosclerosis of aorta: Secondary | ICD-10-CM | POA: Diagnosis not present

## 2020-02-22 DIAGNOSIS — M40204 Unspecified kyphosis, thoracic region: Secondary | ICD-10-CM | POA: Diagnosis not present

## 2020-02-22 DIAGNOSIS — Z1389 Encounter for screening for other disorder: Secondary | ICD-10-CM | POA: Diagnosis not present

## 2020-02-22 DIAGNOSIS — M549 Dorsalgia, unspecified: Secondary | ICD-10-CM

## 2020-02-22 DIAGNOSIS — E1165 Type 2 diabetes mellitus with hyperglycemia: Secondary | ICD-10-CM | POA: Diagnosis not present

## 2020-02-22 DIAGNOSIS — Z125 Encounter for screening for malignant neoplasm of prostate: Secondary | ICD-10-CM | POA: Diagnosis not present

## 2020-02-22 DIAGNOSIS — E7800 Pure hypercholesterolemia, unspecified: Secondary | ICD-10-CM | POA: Diagnosis not present

## 2020-02-22 DIAGNOSIS — I872 Venous insufficiency (chronic) (peripheral): Secondary | ICD-10-CM | POA: Diagnosis not present

## 2020-02-22 DIAGNOSIS — I10 Essential (primary) hypertension: Secondary | ICD-10-CM | POA: Diagnosis not present

## 2020-02-22 DIAGNOSIS — Z23 Encounter for immunization: Secondary | ICD-10-CM | POA: Diagnosis not present

## 2020-02-22 DIAGNOSIS — M5136 Other intervertebral disc degeneration, lumbar region: Secondary | ICD-10-CM | POA: Diagnosis not present

## 2020-02-22 DIAGNOSIS — E113313 Type 2 diabetes mellitus with moderate nonproliferative diabetic retinopathy with macular edema, bilateral: Secondary | ICD-10-CM | POA: Diagnosis not present

## 2020-02-22 DIAGNOSIS — Z794 Long term (current) use of insulin: Secondary | ICD-10-CM | POA: Diagnosis not present

## 2020-02-22 DIAGNOSIS — G8929 Other chronic pain: Secondary | ICD-10-CM | POA: Diagnosis not present

## 2020-02-22 DIAGNOSIS — I890 Lymphedema, not elsewhere classified: Secondary | ICD-10-CM | POA: Diagnosis not present

## 2020-02-22 DIAGNOSIS — I739 Peripheral vascular disease, unspecified: Secondary | ICD-10-CM | POA: Diagnosis not present

## 2020-02-22 DIAGNOSIS — Z0000 Encounter for general adult medical examination without abnormal findings: Secondary | ICD-10-CM | POA: Diagnosis not present

## 2020-02-22 DIAGNOSIS — M4186 Other forms of scoliosis, lumbar region: Secondary | ICD-10-CM | POA: Diagnosis not present

## 2020-02-22 DIAGNOSIS — Z1211 Encounter for screening for malignant neoplasm of colon: Secondary | ICD-10-CM | POA: Diagnosis not present

## 2020-02-23 ENCOUNTER — Encounter (HOSPITAL_COMMUNITY): Payer: Medicare Other | Admitting: Physical Therapy

## 2020-02-26 ENCOUNTER — Encounter (HOSPITAL_COMMUNITY): Payer: Medicare Other | Admitting: Physical Therapy

## 2020-02-28 ENCOUNTER — Encounter (HOSPITAL_COMMUNITY): Payer: Medicare Other | Admitting: Physical Therapy

## 2020-03-01 ENCOUNTER — Encounter (HOSPITAL_COMMUNITY): Payer: Medicare Other

## 2020-03-13 DIAGNOSIS — E113512 Type 2 diabetes mellitus with proliferative diabetic retinopathy with macular edema, left eye: Secondary | ICD-10-CM | POA: Diagnosis not present

## 2020-03-14 DIAGNOSIS — M545 Low back pain, unspecified: Secondary | ICD-10-CM | POA: Diagnosis not present

## 2020-04-02 DIAGNOSIS — M545 Low back pain, unspecified: Secondary | ICD-10-CM | POA: Diagnosis not present

## 2020-04-04 DIAGNOSIS — M545 Low back pain, unspecified: Secondary | ICD-10-CM | POA: Diagnosis not present

## 2020-04-09 DIAGNOSIS — M545 Low back pain, unspecified: Secondary | ICD-10-CM | POA: Diagnosis not present

## 2020-04-11 DIAGNOSIS — M545 Low back pain, unspecified: Secondary | ICD-10-CM | POA: Diagnosis not present

## 2020-04-16 DIAGNOSIS — M545 Low back pain, unspecified: Secondary | ICD-10-CM | POA: Diagnosis not present

## 2020-04-17 DIAGNOSIS — Z7984 Long term (current) use of oral hypoglycemic drugs: Secondary | ICD-10-CM | POA: Diagnosis not present

## 2020-04-17 DIAGNOSIS — E1165 Type 2 diabetes mellitus with hyperglycemia: Secondary | ICD-10-CM | POA: Diagnosis not present

## 2020-04-18 DIAGNOSIS — M545 Low back pain, unspecified: Secondary | ICD-10-CM | POA: Diagnosis not present

## 2020-04-23 DIAGNOSIS — M545 Low back pain, unspecified: Secondary | ICD-10-CM | POA: Diagnosis not present

## 2020-04-24 DIAGNOSIS — L97509 Non-pressure chronic ulcer of other part of unspecified foot with unspecified severity: Secondary | ICD-10-CM | POA: Diagnosis not present

## 2020-04-24 DIAGNOSIS — Z794 Long term (current) use of insulin: Secondary | ICD-10-CM | POA: Diagnosis not present

## 2020-04-24 DIAGNOSIS — E1165 Type 2 diabetes mellitus with hyperglycemia: Secondary | ICD-10-CM | POA: Diagnosis not present

## 2020-04-26 DIAGNOSIS — M545 Low back pain, unspecified: Secondary | ICD-10-CM | POA: Diagnosis not present

## 2020-04-29 DIAGNOSIS — E1140 Type 2 diabetes mellitus with diabetic neuropathy, unspecified: Secondary | ICD-10-CM | POA: Diagnosis not present

## 2020-04-29 DIAGNOSIS — E1165 Type 2 diabetes mellitus with hyperglycemia: Secondary | ICD-10-CM | POA: Diagnosis not present

## 2020-04-29 DIAGNOSIS — E7800 Pure hypercholesterolemia, unspecified: Secondary | ICD-10-CM | POA: Diagnosis not present

## 2020-04-29 DIAGNOSIS — I10 Essential (primary) hypertension: Secondary | ICD-10-CM | POA: Diagnosis not present

## 2020-04-30 DIAGNOSIS — M545 Low back pain, unspecified: Secondary | ICD-10-CM | POA: Diagnosis not present

## 2020-05-02 DIAGNOSIS — M545 Low back pain, unspecified: Secondary | ICD-10-CM | POA: Diagnosis not present

## 2020-05-07 DIAGNOSIS — M545 Low back pain, unspecified: Secondary | ICD-10-CM | POA: Diagnosis not present

## 2020-05-09 DIAGNOSIS — M545 Low back pain, unspecified: Secondary | ICD-10-CM | POA: Diagnosis not present

## 2020-05-30 DIAGNOSIS — E1165 Type 2 diabetes mellitus with hyperglycemia: Secondary | ICD-10-CM | POA: Diagnosis not present

## 2020-05-30 DIAGNOSIS — I10 Essential (primary) hypertension: Secondary | ICD-10-CM | POA: Diagnosis not present

## 2020-05-30 DIAGNOSIS — E7800 Pure hypercholesterolemia, unspecified: Secondary | ICD-10-CM | POA: Diagnosis not present

## 2020-05-30 DIAGNOSIS — E1140 Type 2 diabetes mellitus with diabetic neuropathy, unspecified: Secondary | ICD-10-CM | POA: Diagnosis not present

## 2020-06-10 DIAGNOSIS — E113512 Type 2 diabetes mellitus with proliferative diabetic retinopathy with macular edema, left eye: Secondary | ICD-10-CM | POA: Diagnosis not present

## 2020-06-20 DIAGNOSIS — Z23 Encounter for immunization: Secondary | ICD-10-CM | POA: Diagnosis not present

## 2020-06-24 DIAGNOSIS — I872 Venous insufficiency (chronic) (peripheral): Secondary | ICD-10-CM | POA: Diagnosis not present

## 2020-06-24 DIAGNOSIS — Z794 Long term (current) use of insulin: Secondary | ICD-10-CM | POA: Diagnosis not present

## 2020-06-24 DIAGNOSIS — E113313 Type 2 diabetes mellitus with moderate nonproliferative diabetic retinopathy with macular edema, bilateral: Secondary | ICD-10-CM | POA: Diagnosis not present

## 2020-06-24 DIAGNOSIS — I739 Peripheral vascular disease, unspecified: Secondary | ICD-10-CM | POA: Diagnosis not present

## 2020-06-24 DIAGNOSIS — E1165 Type 2 diabetes mellitus with hyperglycemia: Secondary | ICD-10-CM | POA: Diagnosis not present

## 2020-06-24 DIAGNOSIS — I10 Essential (primary) hypertension: Secondary | ICD-10-CM | POA: Diagnosis not present

## 2020-06-24 DIAGNOSIS — E7800 Pure hypercholesterolemia, unspecified: Secondary | ICD-10-CM | POA: Diagnosis not present

## 2020-06-28 DIAGNOSIS — I10 Essential (primary) hypertension: Secondary | ICD-10-CM | POA: Diagnosis not present

## 2020-06-28 DIAGNOSIS — E1140 Type 2 diabetes mellitus with diabetic neuropathy, unspecified: Secondary | ICD-10-CM | POA: Diagnosis not present

## 2020-06-28 DIAGNOSIS — E1165 Type 2 diabetes mellitus with hyperglycemia: Secondary | ICD-10-CM | POA: Diagnosis not present

## 2020-06-28 DIAGNOSIS — E7800 Pure hypercholesterolemia, unspecified: Secondary | ICD-10-CM | POA: Diagnosis not present

## 2020-07-03 DIAGNOSIS — E1140 Type 2 diabetes mellitus with diabetic neuropathy, unspecified: Secondary | ICD-10-CM | POA: Diagnosis not present

## 2020-07-03 DIAGNOSIS — I10 Essential (primary) hypertension: Secondary | ICD-10-CM | POA: Diagnosis not present

## 2020-07-03 DIAGNOSIS — E1165 Type 2 diabetes mellitus with hyperglycemia: Secondary | ICD-10-CM | POA: Diagnosis not present

## 2020-07-03 DIAGNOSIS — E7800 Pure hypercholesterolemia, unspecified: Secondary | ICD-10-CM | POA: Diagnosis not present

## 2020-07-09 DIAGNOSIS — Z794 Long term (current) use of insulin: Secondary | ICD-10-CM | POA: Diagnosis not present

## 2020-07-09 DIAGNOSIS — E1165 Type 2 diabetes mellitus with hyperglycemia: Secondary | ICD-10-CM | POA: Diagnosis not present

## 2020-08-12 DIAGNOSIS — E7800 Pure hypercholesterolemia, unspecified: Secondary | ICD-10-CM | POA: Diagnosis not present

## 2020-08-12 DIAGNOSIS — E1140 Type 2 diabetes mellitus with diabetic neuropathy, unspecified: Secondary | ICD-10-CM | POA: Diagnosis not present

## 2020-08-12 DIAGNOSIS — E1165 Type 2 diabetes mellitus with hyperglycemia: Secondary | ICD-10-CM | POA: Diagnosis not present

## 2020-08-12 DIAGNOSIS — I10 Essential (primary) hypertension: Secondary | ICD-10-CM | POA: Diagnosis not present

## 2020-09-09 ENCOUNTER — Other Ambulatory Visit: Payer: Self-pay | Admitting: Internal Medicine

## 2020-09-09 ENCOUNTER — Ambulatory Visit
Admission: RE | Admit: 2020-09-09 | Discharge: 2020-09-09 | Disposition: A | Discharge status: Home / Self Care | Payer: Medicare Other | Source: Ambulatory Visit | Attending: Internal Medicine | Admitting: Internal Medicine

## 2020-09-09 DIAGNOSIS — Z794 Long term (current) use of insulin: Secondary | ICD-10-CM | POA: Diagnosis not present

## 2020-09-09 DIAGNOSIS — R52 Pain, unspecified: Secondary | ICD-10-CM

## 2020-09-09 DIAGNOSIS — M160 Bilateral primary osteoarthritis of hip: Secondary | ICD-10-CM | POA: Diagnosis not present

## 2020-09-09 DIAGNOSIS — E1165 Type 2 diabetes mellitus with hyperglycemia: Secondary | ICD-10-CM | POA: Diagnosis not present

## 2020-09-09 DIAGNOSIS — I708 Atherosclerosis of other arteries: Secondary | ICD-10-CM | POA: Diagnosis not present

## 2020-09-13 DIAGNOSIS — E1140 Type 2 diabetes mellitus with diabetic neuropathy, unspecified: Secondary | ICD-10-CM | POA: Diagnosis not present

## 2020-09-13 DIAGNOSIS — E7800 Pure hypercholesterolemia, unspecified: Secondary | ICD-10-CM | POA: Diagnosis not present

## 2020-09-13 DIAGNOSIS — I10 Essential (primary) hypertension: Secondary | ICD-10-CM | POA: Diagnosis not present

## 2020-09-13 DIAGNOSIS — E1165 Type 2 diabetes mellitus with hyperglycemia: Secondary | ICD-10-CM | POA: Diagnosis not present

## 2020-10-29 DIAGNOSIS — I10 Essential (primary) hypertension: Secondary | ICD-10-CM | POA: Diagnosis not present

## 2020-10-29 DIAGNOSIS — E7800 Pure hypercholesterolemia, unspecified: Secondary | ICD-10-CM | POA: Diagnosis not present

## 2020-10-29 DIAGNOSIS — E1122 Type 2 diabetes mellitus with diabetic chronic kidney disease: Secondary | ICD-10-CM | POA: Diagnosis not present

## 2020-10-29 DIAGNOSIS — E1165 Type 2 diabetes mellitus with hyperglycemia: Secondary | ICD-10-CM | POA: Diagnosis not present

## 2020-10-29 DIAGNOSIS — E1140 Type 2 diabetes mellitus with diabetic neuropathy, unspecified: Secondary | ICD-10-CM | POA: Diagnosis not present

## 2020-11-28 DIAGNOSIS — E1140 Type 2 diabetes mellitus with diabetic neuropathy, unspecified: Secondary | ICD-10-CM | POA: Diagnosis not present

## 2020-11-28 DIAGNOSIS — E1165 Type 2 diabetes mellitus with hyperglycemia: Secondary | ICD-10-CM | POA: Diagnosis not present

## 2020-11-28 DIAGNOSIS — E7800 Pure hypercholesterolemia, unspecified: Secondary | ICD-10-CM | POA: Diagnosis not present

## 2020-11-28 DIAGNOSIS — E1122 Type 2 diabetes mellitus with diabetic chronic kidney disease: Secondary | ICD-10-CM | POA: Diagnosis not present

## 2020-11-28 DIAGNOSIS — I10 Essential (primary) hypertension: Secondary | ICD-10-CM | POA: Diagnosis not present

## 2021-01-20 DIAGNOSIS — E1140 Type 2 diabetes mellitus with diabetic neuropathy, unspecified: Secondary | ICD-10-CM | POA: Diagnosis not present

## 2021-01-20 DIAGNOSIS — E7800 Pure hypercholesterolemia, unspecified: Secondary | ICD-10-CM | POA: Diagnosis not present

## 2021-01-20 DIAGNOSIS — I10 Essential (primary) hypertension: Secondary | ICD-10-CM | POA: Diagnosis not present

## 2021-01-20 DIAGNOSIS — E1122 Type 2 diabetes mellitus with diabetic chronic kidney disease: Secondary | ICD-10-CM | POA: Diagnosis not present

## 2021-01-20 DIAGNOSIS — E1165 Type 2 diabetes mellitus with hyperglycemia: Secondary | ICD-10-CM | POA: Diagnosis not present

## 2021-02-26 DIAGNOSIS — Z125 Encounter for screening for malignant neoplasm of prostate: Secondary | ICD-10-CM | POA: Diagnosis not present

## 2021-02-26 DIAGNOSIS — E1165 Type 2 diabetes mellitus with hyperglycemia: Secondary | ICD-10-CM | POA: Diagnosis not present

## 2021-02-26 DIAGNOSIS — I700 Atherosclerosis of aorta: Secondary | ICD-10-CM | POA: Diagnosis not present

## 2021-02-26 DIAGNOSIS — Z1389 Encounter for screening for other disorder: Secondary | ICD-10-CM | POA: Diagnosis not present

## 2021-02-26 DIAGNOSIS — I872 Venous insufficiency (chronic) (peripheral): Secondary | ICD-10-CM | POA: Diagnosis not present

## 2021-02-26 DIAGNOSIS — Z23 Encounter for immunization: Secondary | ICD-10-CM | POA: Diagnosis not present

## 2021-02-26 DIAGNOSIS — E1140 Type 2 diabetes mellitus with diabetic neuropathy, unspecified: Secondary | ICD-10-CM | POA: Diagnosis not present

## 2021-02-26 DIAGNOSIS — Z794 Long term (current) use of insulin: Secondary | ICD-10-CM | POA: Diagnosis not present

## 2021-02-26 DIAGNOSIS — E113313 Type 2 diabetes mellitus with moderate nonproliferative diabetic retinopathy with macular edema, bilateral: Secondary | ICD-10-CM | POA: Diagnosis not present

## 2021-02-26 DIAGNOSIS — E7800 Pure hypercholesterolemia, unspecified: Secondary | ICD-10-CM | POA: Diagnosis not present

## 2021-02-26 DIAGNOSIS — I739 Peripheral vascular disease, unspecified: Secondary | ICD-10-CM | POA: Diagnosis not present

## 2021-02-26 DIAGNOSIS — I890 Lymphedema, not elsewhere classified: Secondary | ICD-10-CM | POA: Diagnosis not present

## 2021-02-26 DIAGNOSIS — Z0000 Encounter for general adult medical examination without abnormal findings: Secondary | ICD-10-CM | POA: Diagnosis not present

## 2021-02-26 DIAGNOSIS — I10 Essential (primary) hypertension: Secondary | ICD-10-CM | POA: Diagnosis not present

## 2021-04-30 DIAGNOSIS — E1140 Type 2 diabetes mellitus with diabetic neuropathy, unspecified: Secondary | ICD-10-CM | POA: Diagnosis not present

## 2021-04-30 DIAGNOSIS — E1122 Type 2 diabetes mellitus with diabetic chronic kidney disease: Secondary | ICD-10-CM | POA: Diagnosis not present

## 2021-04-30 DIAGNOSIS — E1165 Type 2 diabetes mellitus with hyperglycemia: Secondary | ICD-10-CM | POA: Diagnosis not present

## 2021-04-30 DIAGNOSIS — I10 Essential (primary) hypertension: Secondary | ICD-10-CM | POA: Diagnosis not present

## 2021-04-30 DIAGNOSIS — E7800 Pure hypercholesterolemia, unspecified: Secondary | ICD-10-CM | POA: Diagnosis not present

## 2021-05-29 DIAGNOSIS — E1340 Other specified diabetes mellitus with diabetic neuropathy, unspecified: Secondary | ICD-10-CM | POA: Diagnosis not present

## 2021-05-29 DIAGNOSIS — I872 Venous insufficiency (chronic) (peripheral): Secondary | ICD-10-CM | POA: Diagnosis not present

## 2021-06-20 DIAGNOSIS — E1165 Type 2 diabetes mellitus with hyperglycemia: Secondary | ICD-10-CM | POA: Diagnosis not present

## 2021-06-20 DIAGNOSIS — I10 Essential (primary) hypertension: Secondary | ICD-10-CM | POA: Diagnosis not present

## 2021-06-20 DIAGNOSIS — E1140 Type 2 diabetes mellitus with diabetic neuropathy, unspecified: Secondary | ICD-10-CM | POA: Diagnosis not present

## 2021-06-20 DIAGNOSIS — E1122 Type 2 diabetes mellitus with diabetic chronic kidney disease: Secondary | ICD-10-CM | POA: Diagnosis not present

## 2021-06-20 DIAGNOSIS — E7800 Pure hypercholesterolemia, unspecified: Secondary | ICD-10-CM | POA: Diagnosis not present

## 2021-07-23 DIAGNOSIS — E1165 Type 2 diabetes mellitus with hyperglycemia: Secondary | ICD-10-CM | POA: Diagnosis not present

## 2021-07-23 DIAGNOSIS — E7800 Pure hypercholesterolemia, unspecified: Secondary | ICD-10-CM | POA: Diagnosis not present

## 2021-07-23 DIAGNOSIS — I10 Essential (primary) hypertension: Secondary | ICD-10-CM | POA: Diagnosis not present

## 2022-02-26 ENCOUNTER — Other Ambulatory Visit: Payer: Self-pay

## 2022-02-26 ENCOUNTER — Emergency Department (HOSPITAL_BASED_OUTPATIENT_CLINIC_OR_DEPARTMENT_OTHER)
Admission: EM | Admit: 2022-02-26 | Discharge: 2022-02-26 | Disposition: A | Discharge status: Home / Self Care | Payer: Medicare Other | Attending: Emergency Medicine | Admitting: Emergency Medicine

## 2022-02-26 DIAGNOSIS — I872 Venous insufficiency (chronic) (peripheral): Secondary | ICD-10-CM | POA: Diagnosis not present

## 2022-02-26 DIAGNOSIS — I878 Other specified disorders of veins: Secondary | ICD-10-CM

## 2022-02-26 DIAGNOSIS — I10 Essential (primary) hypertension: Secondary | ICD-10-CM | POA: Insufficient documentation

## 2022-02-26 DIAGNOSIS — L03116 Cellulitis of left lower limb: Secondary | ICD-10-CM | POA: Diagnosis not present

## 2022-02-26 DIAGNOSIS — R600 Localized edema: Secondary | ICD-10-CM | POA: Diagnosis not present

## 2022-02-26 DIAGNOSIS — M7989 Other specified soft tissue disorders: Secondary | ICD-10-CM | POA: Diagnosis present

## 2022-02-26 DIAGNOSIS — L0390 Cellulitis, unspecified: Secondary | ICD-10-CM

## 2022-02-26 DIAGNOSIS — Z794 Long term (current) use of insulin: Secondary | ICD-10-CM | POA: Diagnosis not present

## 2022-02-26 DIAGNOSIS — R609 Edema, unspecified: Secondary | ICD-10-CM

## 2022-02-26 DIAGNOSIS — E119 Type 2 diabetes mellitus without complications: Secondary | ICD-10-CM | POA: Insufficient documentation

## 2022-02-26 DIAGNOSIS — Z79899 Other long term (current) drug therapy: Secondary | ICD-10-CM | POA: Insufficient documentation

## 2022-02-26 DIAGNOSIS — L03115 Cellulitis of right lower limb: Secondary | ICD-10-CM | POA: Diagnosis not present

## 2022-02-26 LAB — CBC WITH DIFFERENTIAL/PLATELET
Abs Immature Granulocytes: 0.02 10*3/uL (ref 0.00–0.07)
Basophils Absolute: 0.1 10*3/uL (ref 0.0–0.1)
Basophils Relative: 2 %
Eosinophils Absolute: 0.6 10*3/uL — ABNORMAL HIGH (ref 0.0–0.5)
Eosinophils Relative: 11 %
HCT: 40.1 % (ref 39.0–52.0)
Hemoglobin: 13.8 g/dL (ref 13.0–17.0)
Immature Granulocytes: 0 %
Lymphocytes Relative: 25 %
Lymphs Abs: 1.3 10*3/uL (ref 0.7–4.0)
MCH: 29.7 pg (ref 26.0–34.0)
MCHC: 34.4 g/dL (ref 30.0–36.0)
MCV: 86.2 fL (ref 80.0–100.0)
Monocytes Absolute: 0.4 10*3/uL (ref 0.1–1.0)
Monocytes Relative: 7 %
Neutro Abs: 2.9 10*3/uL (ref 1.7–7.7)
Neutrophils Relative %: 55 %
Platelets: 228 10*3/uL (ref 150–400)
RBC: 4.65 MIL/uL (ref 4.22–5.81)
RDW: 12.7 % (ref 11.5–15.5)
WBC: 5.3 10*3/uL (ref 4.0–10.5)
nRBC: 0 % (ref 0.0–0.2)

## 2022-02-26 LAB — BASIC METABOLIC PANEL
Anion gap: 7 (ref 5–15)
BUN: 14 mg/dL (ref 8–23)
CO2: 23 mmol/L (ref 22–32)
Calcium: 9.2 mg/dL (ref 8.9–10.3)
Chloride: 110 mmol/L (ref 98–111)
Creatinine, Ser: 0.59 mg/dL — ABNORMAL LOW (ref 0.61–1.24)
GFR, Estimated: >60 mL/min (ref >60)
Glucose, Bld: 127 mg/dL — ABNORMAL HIGH (ref 70–99)
Potassium: 3.9 mmol/L (ref 3.5–5.1)
Sodium: 140 mmol/L (ref 135–145)

## 2022-02-26 LAB — CBG MONITORING, ED: Glucose-Capillary: 124 mg/dL — ABNORMAL HIGH (ref 70–99)

## 2022-02-26 LAB — BRAIN NATRIURETIC PEPTIDE: B Natriuretic Peptide: 70.5 pg/mL (ref 0.0–100.0)

## 2022-02-26 MED ORDER — MUPIROCIN CALCIUM 2 % EX CREA: 1.0000 | TOPICAL_CREAM | Freq: Two times a day (BID) | CUTANEOUS | 0 refills | Status: DC

## 2022-02-26 MED ORDER — FUROSEMIDE 20 MG PO TABS: 20.0000 mg | ORAL_TABLET | Freq: Every day | ORAL | 0 refills | Status: DC

## 2022-02-26 MED ORDER — CEPHALEXIN 500 MG PO CAPS: 500.0000 mg | ORAL_CAPSULE | Freq: Three times a day (TID) | ORAL | 0 refills | Status: AC

## 2022-02-26 NOTE — ED Triage Notes (Signed)
Pt to ED c/o worsening bilat lower extremity edema and diabetic ulcers over the past couple weeks.  Reports unable to get in with the foot doctor for 1 month. Denies current [facility]. Ambulatory in triage

## 2022-02-26 NOTE — ED Provider Notes (Signed)
[facility] EMERGENCY DEPARTMENT Provider Note   CSN: [id number] Arrival date & time: 02/26/22  1144     History  Chief Complaint  Patient presents with   Leg Swelling   Diabetes    Philip Holt is a 71 y.o. male.  HPI     71yo male with history of DM, htn, hlpd, who presents with concern for bilateral lower extremity swelling.   Bilateral lower extremity swelling 10/10 rescheduled podiatry appt Progressively worsening lower ext swelling making ambulating difficulty over last few weeks.  No chest pain, no dyspnea, no cough, no abdominal pain Wounds bilateral lower extremities, worse on left, right No fever/chills/n/v  Past Medical History:  Diagnosis Date   ADD (attention deficit disorder)    evaluated by psych in the past   Chicken pox    Diabetes mellitus type II, uncontrolled (HCC)    Diabetic retinopathy    Dr Robby Sermon proliferative   Erectile dysfunction    Hyperlipidemia    Hypertension    Non-compliance with treatment      Home Medications Prior to Admission medications   Medication Sig Start Date End Date Taking? Authorizing Provider  cephALEXin (KEFLEX) 500 MG capsule Take 1 capsule (500 mg total) by mouth 3 (three) times daily for 7 days. 02/26/22 03/05/22 Yes Alvira Monday, MD  furosemide (LASIX) 20 MG tablet Take 1 tablet (20 mg total) by mouth daily. 02/26/22  Yes Alvira Monday, MD  mupirocin cream (BACTROBAN) 2 % Apply 1 Application topically 2 (two) times daily. 02/26/22  Yes Alvira Monday, MD  atorvastatin (LIPITOR) 40 MG tablet Take 40 mg by mouth daily. 06/15/19   [provider]  cholecalciferol (VITAMIN D3) 25 MCG (1000 UNIT) tablet Take 1,000 Units by mouth daily.    [provider]  co-enzyme Q-10 30 MG capsule Take 30 mg by mouth 3 (three) times daily.    [provider]  insulin lispro (HUMALOG) 100 UNIT/ML injection Inject 0.05 mLs (5 Units total) into the skin 3 (three) times daily before  meals. 09/27/14   Tysinger, Kermit Balo, PA-C  LANTUS SOLOSTAR 100 UNIT/ML Solostar Pen  03/09/19   [provider]  metFORMIN (GLUCOPHAGE) 500 MG tablet Take 500 mg by mouth 2 (two) times daily with a meal.    [provider]  naproxen (NAPROSYN) 500 MG tablet Take 1 tablet (500 mg total) by mouth 2 (two) times daily as needed for mild pain, moderate pain or headache (TAKE WITH MEALS.). 10/15/15   Street, [place], PA-C  Omega-3 300 MG CAPS Take by mouth.    [provider]      Allergies    Patient has no known allergies.    Review of Systems   Review of Systems  Physical Exam Updated Vital Signs BP (!) 143/71   Pulse 71   Temp 98.4 F (36.9 C) (Oral)   Resp 16   Wt 62.6 kg   SpO2 98%   BMI 24.84 kg/m  Physical Exam Vitals and nursing note reviewed.  Constitutional:      General: He is not in acute distress.    Appearance: He is well-developed. He is not diaphoretic.  HENT:     Head: Normocephalic and atraumatic.  Eyes:     Conjunctiva/sclera: Conjunctivae normal.  Cardiovascular:     Rate and Rhythm: Normal rate and regular rhythm.     Heart sounds: Normal heart sounds. No murmur heard.    No friction rub. No gallop.  Pulmonary:  Effort: Pulmonary effort is normal. No respiratory distress.     Breath sounds: Normal breath sounds. No wheezing or rales.  Abdominal:     General: There is no distension.     Palpations: Abdomen is soft.     Tenderness: There is no abdominal tenderness. There is no guarding.  Musculoskeletal:     Cervical back: Normal range of motion.     Right lower leg: Edema present.     Left lower leg: Edema present.     Comments: 5cm superficial ulcer LLE, sournding erythema mild   Skin:    General: Skin is warm and dry.  Neurological:     Mental Status: He is alert and oriented to person, place, and time.     ED Results / Procedures / Treatments   Labs (all labs ordered are listed, but only abnormal results are  displayed) Labs Reviewed  CBC WITH DIFFERENTIAL/PLATELET - Abnormal; Notable for the following components:      Result Value   Eosinophils Absolute 0.6 (*)    All other components within normal limits  BASIC METABOLIC PANEL - Abnormal; Notable for the following components:   Glucose, Bld 127 (*)    Creatinine, Ser 0.59 (*)    All other components within normal limits  CBG MONITORING, ED - Abnormal; Notable for the following components:   Glucose-Capillary 124 (*)    All other components within normal limits  BRAIN NATRIURETIC PEPTIDE    EKG EKG Interpretation  Date/Time:  Thursday February 26 2022 13:14:02 EDT Ventricular Rate:  75 PR Interval:  257 QRS Duration: 108 QT Interval:  372 QTC Calculation: 416 R Axis:   6 Text Interpretation: Sinus or ectopic atrial rhythm Prolonged PR interval Anterior infarct, old Borderline T abnormalities, inferior leads No previous ECGs available Confirmed by Gareth Morgan [phone]) on 02/26/2022 2:15:42 PM  Radiology No results found.  Procedures Procedures    Medications Ordered in ED Medications - No data to display  ED Course/ Medical Decision Making/ A&P                           Medical Decision Making Amount and/or Complexity of Data Reviewed Labs: ordered.  Risk Prescription drug management.   71yo male with history of DM, htn, hlpd, who presents with concern for bilateral lower extremity swelling.   Palpable bilateral LE pulses, don not suspect acute arterial thrombus.  No asymmetric swelling, low suspicion for DVT.   No chest pain or dyspnea. BNP WNL. EKG without arrhythmia and doubt ACS/PE. Discussed CHF is on differential for leg swelling and recommend coninued outpatient follow up.  Labs completed and personally read interpreted by me show no significant electrolyte abnormalities, normal creatinine, no anemia or leukocytosis.  Has more erythema surrounding wound on the left side, concerning for early cellulitis.   Do not see signs of abscess.  We will give a prescription for Keflex for outpatient therapy.  Also wrote bactroban given honey crusting appearance.  Discussed options for further care, and will initiate Lasix 20 mg for symptoms of swelling, with need for close outpatient follow-up for repeat labs and reevaluation as well as further evaluation for etiology of leg swelling. Patient discharged in stable condition with understanding of reasons to return.         Final Clinical Impression(s) / ED Diagnoses Final diagnoses:  Peripheral edema  Venous stasis  Venous stasis dermatitis of both lower extremities  Wound cellulitis  Rx / DC Orders ED Discharge Orders          Ordered    cephALEXin (KEFLEX) 500 MG capsule  3 times daily        02/26/22 1441    furosemide (LASIX) 20 MG tablet  Daily        02/26/22 1441    mupirocin cream (BACTROBAN) 2 %  2 times daily        02/26/22 1442              Alvira Monday, MD 02/27/22 2323

## 2022-02-27 DIAGNOSIS — E113313 Type 2 diabetes mellitus with moderate nonproliferative diabetic retinopathy with macular edema, bilateral: Secondary | ICD-10-CM | POA: Diagnosis not present

## 2022-02-27 DIAGNOSIS — E7800 Pure hypercholesterolemia, unspecified: Secondary | ICD-10-CM | POA: Diagnosis not present

## 2022-02-27 DIAGNOSIS — I10 Essential (primary) hypertension: Secondary | ICD-10-CM | POA: Diagnosis not present

## 2022-03-02 DIAGNOSIS — I872 Venous insufficiency (chronic) (peripheral): Secondary | ICD-10-CM | POA: Diagnosis not present

## 2022-03-02 DIAGNOSIS — I10 Essential (primary) hypertension: Secondary | ICD-10-CM | POA: Diagnosis not present

## 2022-03-02 DIAGNOSIS — I739 Peripheral vascular disease, unspecified: Secondary | ICD-10-CM | POA: Diagnosis not present

## 2022-03-02 DIAGNOSIS — E7800 Pure hypercholesterolemia, unspecified: Secondary | ICD-10-CM | POA: Diagnosis not present

## 2022-03-02 DIAGNOSIS — Z23 Encounter for immunization: Secondary | ICD-10-CM | POA: Diagnosis not present

## 2022-03-02 DIAGNOSIS — Z125 Encounter for screening for malignant neoplasm of prostate: Secondary | ICD-10-CM | POA: Diagnosis not present

## 2022-03-02 DIAGNOSIS — Z0000 Encounter for general adult medical examination without abnormal findings: Secondary | ICD-10-CM | POA: Diagnosis not present

## 2022-03-02 DIAGNOSIS — Z794 Long term (current) use of insulin: Secondary | ICD-10-CM | POA: Diagnosis not present

## 2022-03-02 DIAGNOSIS — I700 Atherosclerosis of aorta: Secondary | ICD-10-CM | POA: Diagnosis not present

## 2022-03-02 DIAGNOSIS — Z1331 Encounter for screening for depression: Secondary | ICD-10-CM | POA: Diagnosis not present

## 2022-03-02 DIAGNOSIS — Z1211 Encounter for screening for malignant neoplasm of colon: Secondary | ICD-10-CM | POA: Diagnosis not present

## 2022-03-02 DIAGNOSIS — I890 Lymphedema, not elsewhere classified: Secondary | ICD-10-CM | POA: Diagnosis not present

## 2022-03-02 DIAGNOSIS — E113313 Type 2 diabetes mellitus with moderate nonproliferative diabetic retinopathy with macular edema, bilateral: Secondary | ICD-10-CM | POA: Diagnosis not present

## 2022-03-02 DIAGNOSIS — E1140 Type 2 diabetes mellitus with diabetic neuropathy, unspecified: Secondary | ICD-10-CM | POA: Diagnosis not present

## 2022-03-10 DIAGNOSIS — E1340 Other specified diabetes mellitus with diabetic neuropathy, unspecified: Secondary | ICD-10-CM | POA: Diagnosis not present

## 2022-03-10 DIAGNOSIS — I872 Venous insufficiency (chronic) (peripheral): Secondary | ICD-10-CM | POA: Diagnosis not present

## 2022-03-19 ENCOUNTER — Other Ambulatory Visit: Payer: Self-pay | Admitting: *Deleted

## 2022-03-19 DIAGNOSIS — M7989 Other specified soft tissue disorders: Secondary | ICD-10-CM

## 2022-03-19 DIAGNOSIS — I872 Venous insufficiency (chronic) (peripheral): Secondary | ICD-10-CM | POA: Diagnosis not present

## 2022-03-19 DIAGNOSIS — I10 Essential (primary) hypertension: Secondary | ICD-10-CM | POA: Diagnosis not present

## 2022-03-19 DIAGNOSIS — E1165 Type 2 diabetes mellitus with hyperglycemia: Secondary | ICD-10-CM | POA: Diagnosis not present

## 2022-03-27 IMAGING — CR DG LUMBAR SPINE 2-3V
5 series · 5 of 5 positions shown · non-contrast
Comparison: Thoracic spine radiographs-earlier same day

CLINICAL DATA: Chronic low back pain.

EXAM:
LUMBAR SPINE - 2-3 VIEW

[t l-spine a.p.]
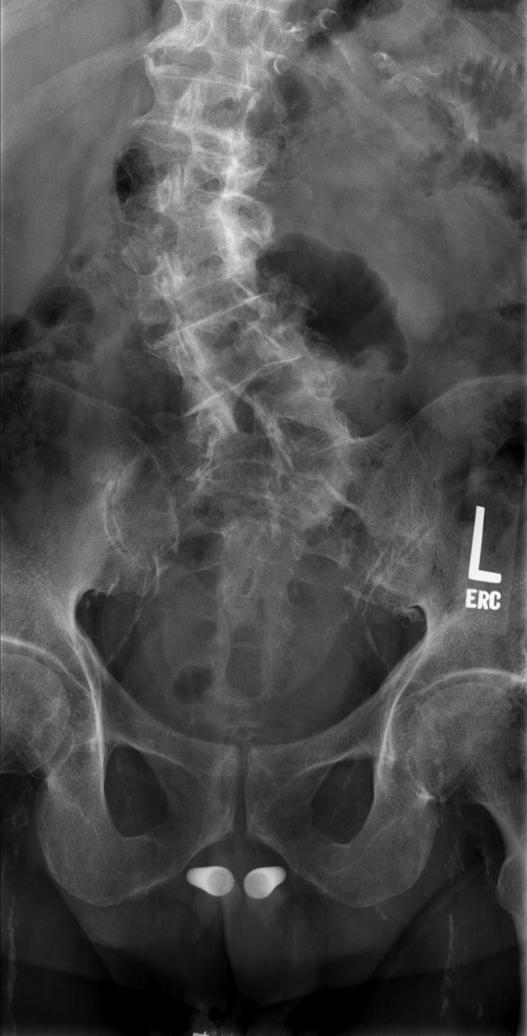

[t l-spine lat]
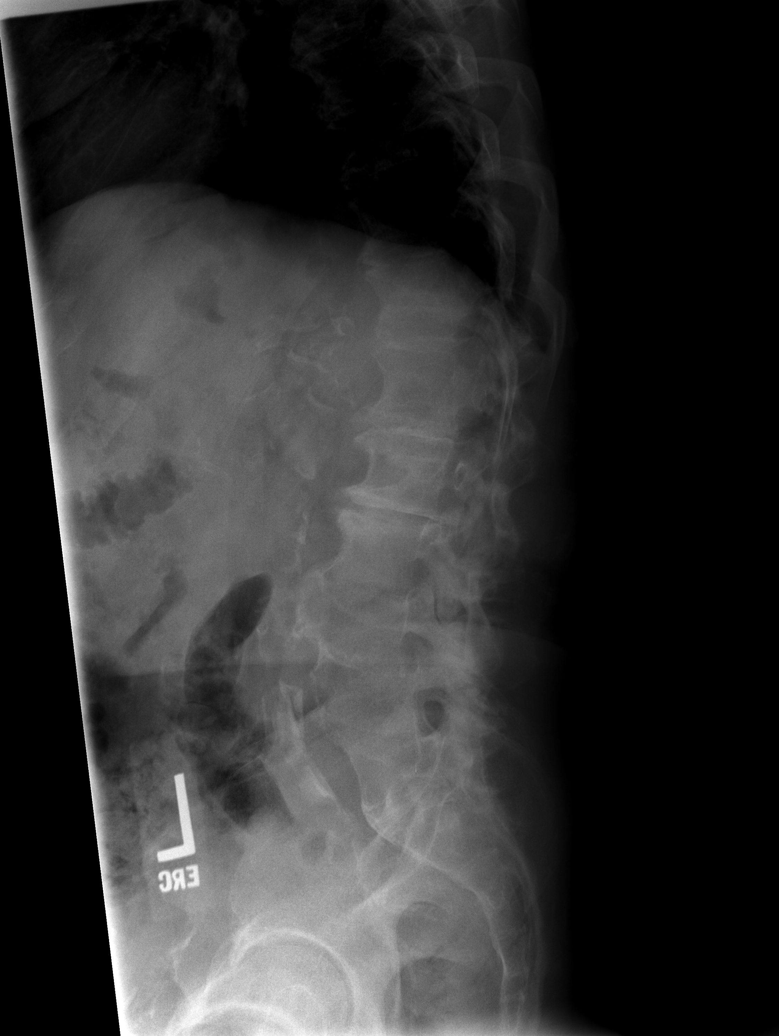

[t l-spine l5-s1 spot (1 of 3)]
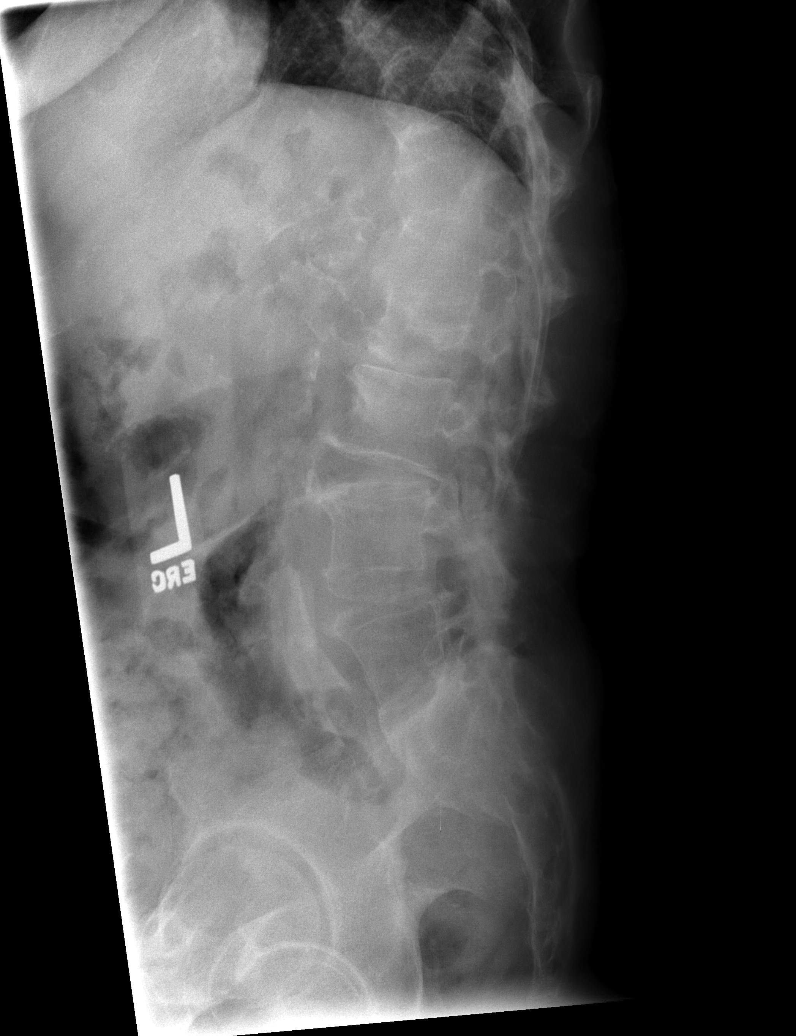

[t l-spine l5-s1 spot (2 of 3)]
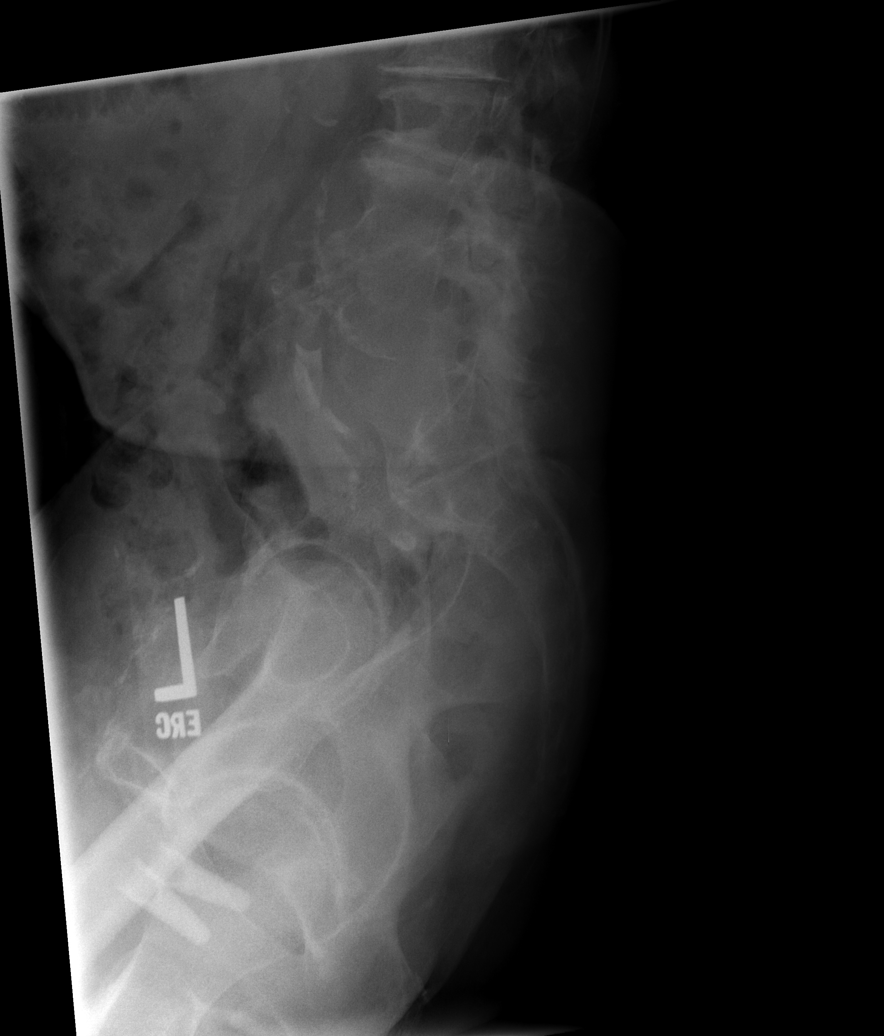

[t l-spine l5-s1 spot (3 of 3)]
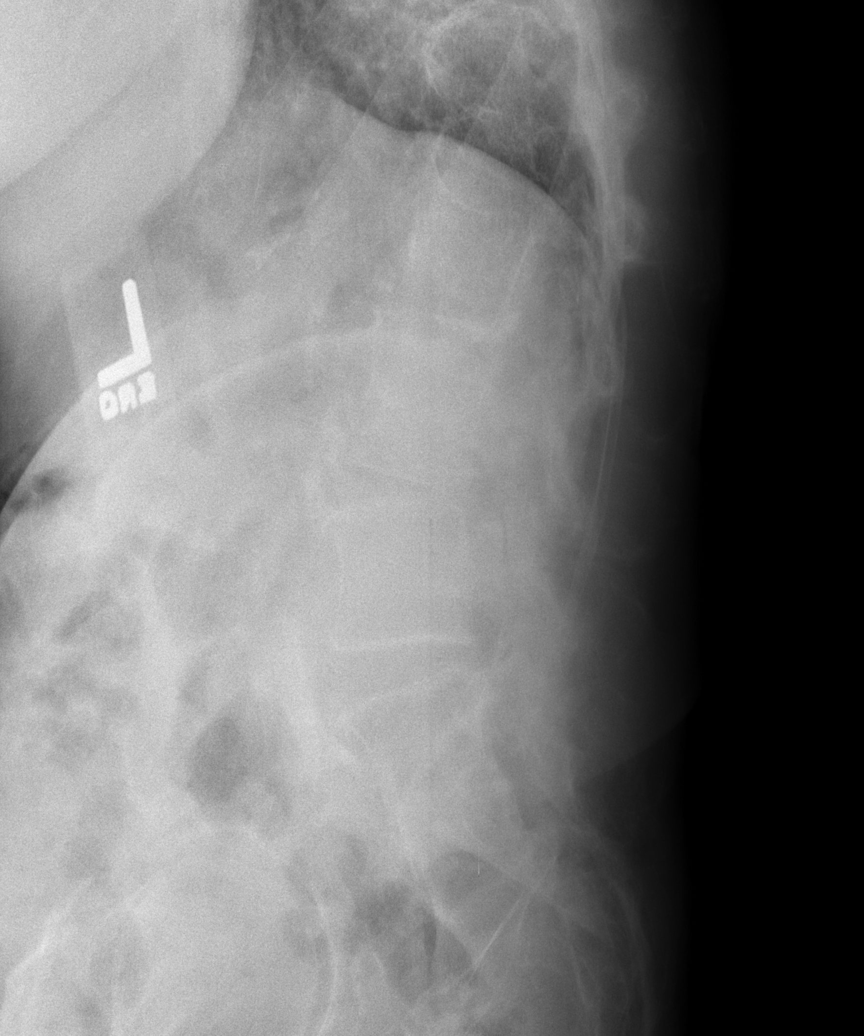

[5 of 5 positions shown; findings below may reference images not displayed]

FINDINGS: There are 5 non rib-bearing lumbar type vertebral bodies.

Moderate severe scoliotic curvature of the thoracolumbar spine with
dominant caudal component convex the right measuring approximately
41 degrees (as measured from the inferior endplate of T12 to the
inferior endplate of L4).

There is mild straightening expected lumbar lordosis. No definite
anterolisthesis or retrolisthesis

Lumbar vertebral body heights appear grossly preserved given
obliquity.

Moderate to severe multilevel lumbar spine DDD, worse at L1-L2,
L2-L3 and L3-L4 with disc space height loss, endplate irregularity
and osteophytosis.

Limited visualization of the bilateral SI joints and hips is normal.

Post penile prosthesis. Vascular calcifications overlie the
abdominal vasculature.
IMPRESSION: 1. Moderate to severe multilevel lumbar spine DDD, worse at L1-L2,
L2-L3 and L3-L4.
2. Moderate to severe scoliotic curvature of the thoracolumbar
spine, measuring approximately 41 degrees, potentially degenerative
in etiology.
3.  Aortic Atherosclerosis ([id number]).

## 2022-03-27 IMAGING — CR DG THORACIC SPINE 3V
2 series · 2 of 2 positions shown · non-contrast
Comparison: Thoracic spine radiographs-earlier same day

CLINICAL DATA: Back pain.

EXAM:
THORACIC SPINE - 3 VIEWS

[t t-spine lat *]
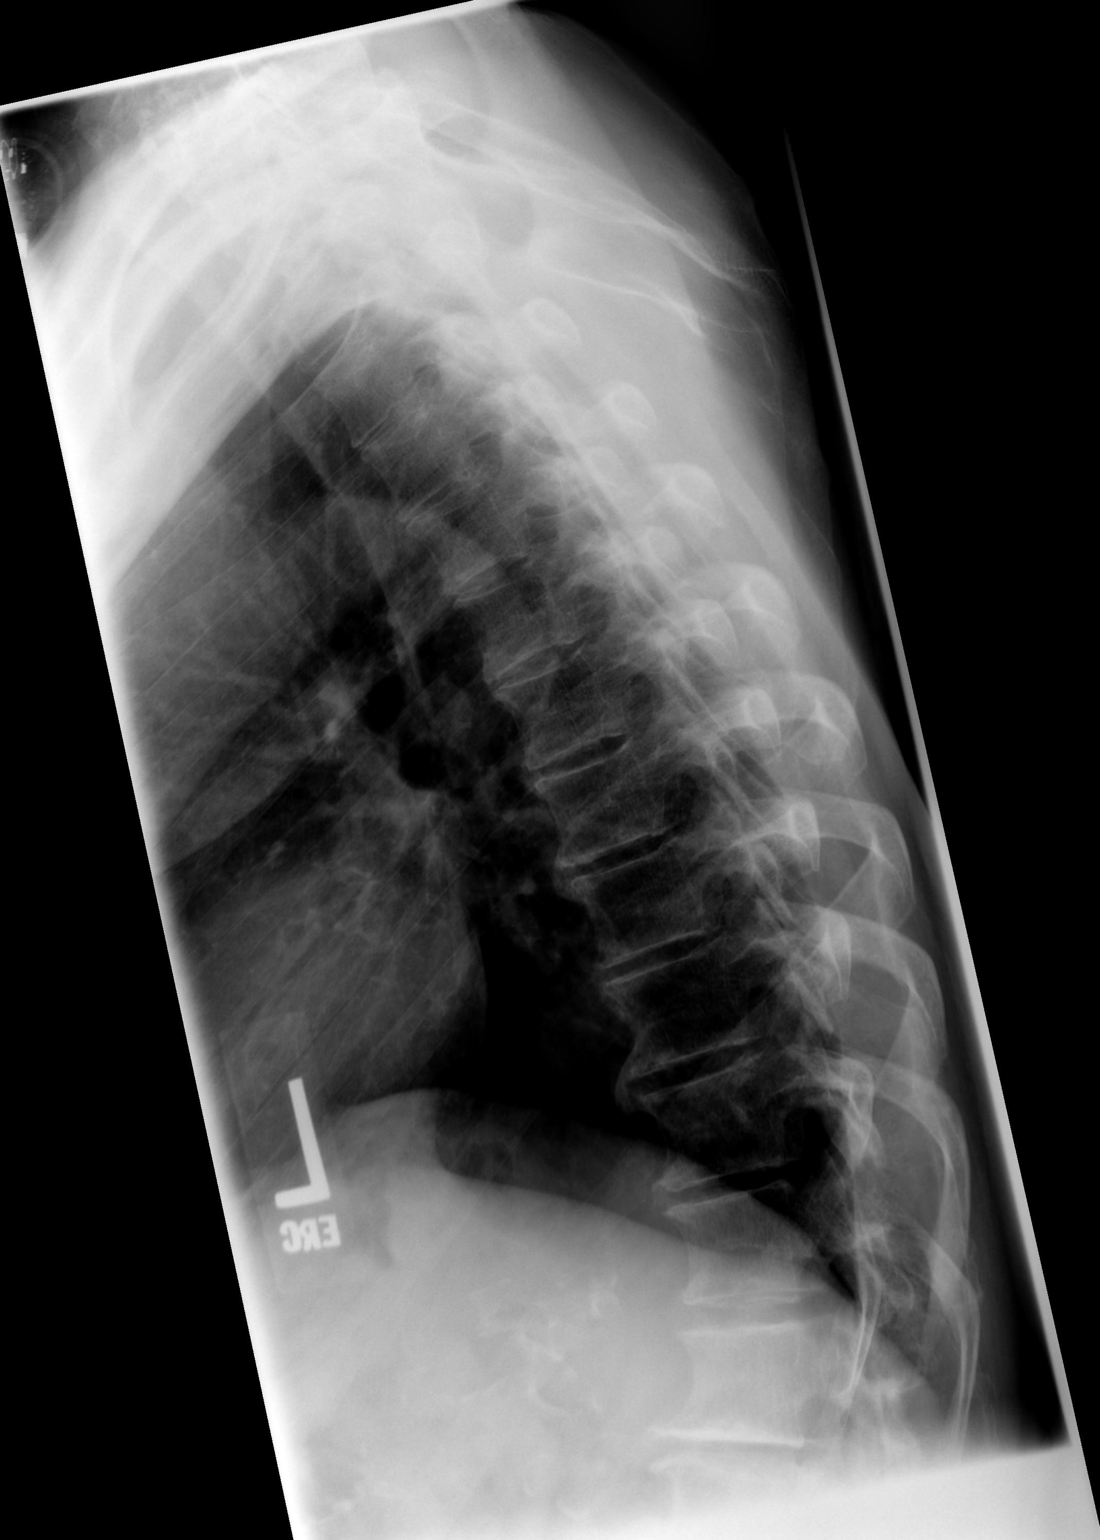

[t swimmers]
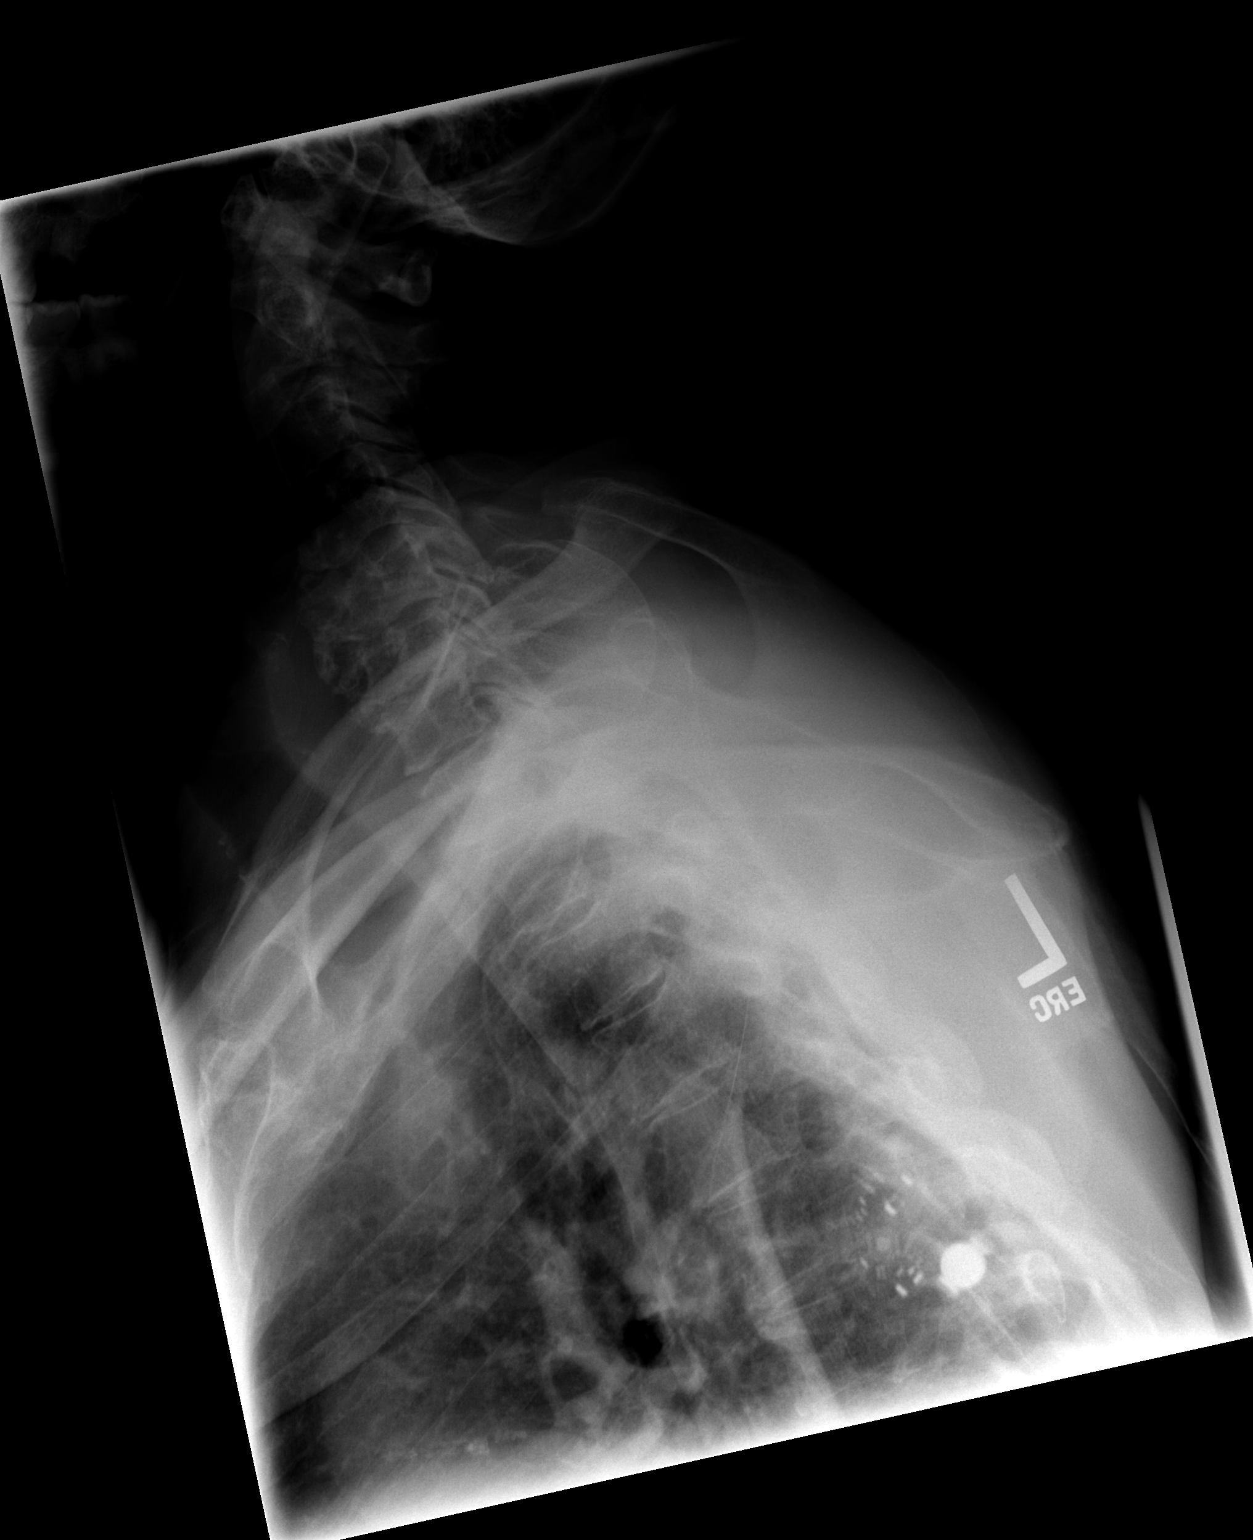

[2 of 2 positions shown; findings below may reference images not displayed]

FINDINGS: An anterior projection of the thoracic spine was not provided for
review.

Straightening of the expected thoracic kyphosis. No anterolisthesis
or retrolisthesis. Thoracic vertebral body heights appear preserved.

Stigmata of dish within the thoracic spine.

Mild to moderate multilevel cervical spine DDD is suspected though
incompletely evaluated. Regional soft tissues appear normal.
IMPRESSION: No explanation for patient's thoracic spine pain.

## 2022-03-31 NOTE — Progress Notes (Unsigned)
[facility]           [facility] [place]  History and Physical   Philip Holt is a 71 y.o. male who presents with BLE swelling.  He does not have any hx of DVT.  His legs have been swelling for quite some time.  He states the swelling is better in the mornings.  He does have skin color changes to his legs.  He thinks his dad may have had some swelling in his later years.  He died in his mid 65's.  He states he tried compression but was unable to put them on as he does not have the strength to do it.  He states he has some pumps at home but stopped using the one set as it was not functioning properly and he has not opened the other set of pumps.  He states he does not exercise much.   He is diabetic.  He takes diuretics and this does help with the swelling.    Pt is originally from [place] and came here in 1980.  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  He is wanting to sell the business.   He was seen by Dr. Myra Gianotti in 2021 for PAD.  He had multifocal vascular disease both inflow and outflow bilaterally.  He did not have specific claudication or open wounds.  He discussed with pt to improve on his medical issues such as improving his blood sugar and cholesterol and high blood pressure.  He recommended asa 81mg .    The pt is on a statin for cholesterol management.  The pt is not on a daily aspirin.   Other AC:  none The pt is on diuretic for hypertension.   The pt is diabetic.   Tobacco hx:  former  Pt does not have family hx of AAA.  Past Medical History:  Diagnosis Date   ADD (attention deficit disorder)    evaluated by psych in the past   Chicken pox    Diabetes mellitus type II, uncontrolled (HCC)    Diabetic retinopathy    Dr proliferative   Erectile dysfunction    Hyperlipidemia    Hypertension    Non-compliance with treatment      Past Surgical History:  Procedure Laterality Date   PENILE PROSTHESIS IMPLANT     WISDOM TOOTH EXTRACTION      Social History   Socioeconomic History   Marital status: Married    Spouse name: Not on file   Number of children: Not on file   Years of education: Not on file   Highest education level: Not on file  Occupational History   Not on file  Tobacco Use   Smoking status: Former    Types: Cigarettes    Quit date: 04/01/2013    Years since quitting: 9.0   Smokeless tobacco: Never  Vaping Use   Vaping Use: Never used  Substance and Sexual Activity   Alcohol use: Yes    Comment: rare   Drug use: No   Sexual activity: Not on file  Other Topics Concern   Not on file  Social History Narrative   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,  [employer] business owner      Married      3 daughters ages 25, 73, 60  Never Smoked      Alcohol use-no   Social Determinants of Health   Financial Resource Strain: Not on file  Food Insecurity: Not on file  Transportation Needs: Not on file  Physical Activity: Not on file  Stress: Not on file  Social Connections: Not on file  Intimate Partner Violence: Not on file    Family History  Problem Relation Age of Onset   Hyperlipidemia Mother    Asthma Mother    Other Father        Bronchiectasis   Asthma Father    Healthy Daughter        x3   Heart disease Neg Hx    Colon cancer Neg Hx     Current Outpatient Medications  Medication Sig Dispense Refill   atorvastatin (LIPITOR) 40 MG tablet Take 40 mg by mouth daily.     cholecalciferol (VITAMIN D3) 25 MCG (1000 UNIT) tablet Take 1,000 Units by mouth daily.     co-enzyme Q-10 30 MG capsule Take 30 mg by mouth 3 (three) times daily.     furosemide (LASIX) 20 MG tablet Take 1 tablet (20 mg total) by mouth daily. 7 tablet 0   insulin lispro (HUMALOG) 100 UNIT/ML injection Inject 0.05 mLs (5 Units total) into the skin 3 (three) times daily before meals. 10 mL 2   LANTUS SOLOSTAR 100 UNIT/ML  Solostar Pen      metFORMIN (GLUCOPHAGE) 500 MG tablet Take 500 mg by mouth 2 (two) times daily with a meal.     mupirocin cream (BACTROBAN) 2 % Apply 1 Application topically 2 (two) times daily. 15 g 0   naproxen (NAPROSYN) 500 MG tablet Take 1 tablet (500 mg total) by mouth 2 (two) times daily as needed for mild pain, moderate pain or headache (TAKE WITH MEALS.). 20 tablet 0   Omega-3 300 MG CAPS Take by mouth.     No current facility-administered medications for this visit.    No Known Allergies  REVIEW OF SYSTEMS:   [X]  denotes positive finding, [ ]  denotes negative finding Cardiac  Comments:  Chest pain or chest pressure:    Shortness of breath upon exertion:    Short of breath when lying flat:    Irregular heart rhythm:        Vascular    Pain in calf, thigh, or hip brought on by ambulation:    Pain in feet at night that wakes you up from your sleep:     Blood clot in your veins:    Leg swelling:  x       Pulmonary    Oxygen at home:    Productive cough:     Wheezing:         Neurologic    Sudden weakness in arms or legs:     Sudden numbness in arms or legs:     Sudden onset of difficulty speaking or slurred speech:    Temporary loss of vision in one eye:     Problems with dizziness:         Gastrointestinal    Blood in stool:     Vomited blood:         Genitourinary    Burning when urinating:     Blood in urine:        Psychiatric    Major depression:         Hematologic    Bleeding problems:    Problems with blood clotting too easily:  Skin    Rashes or ulcers:        Constitutional    Fever or chills:      PHYSICAL EXAMINATION:  Today's Vitals   04/02/22 0934  BP: (!) 158/82  Pulse: 75  Resp: 20  Temp: 98.1 F (36.7 C)  TempSrc: Temporal  SpO2: 99%  Weight: 140 lb (63.5 kg)  Height: 5' 2.5" (1.588 m)  PainSc: 0-No pain   Body mass index is 25.2 kg/m.   General:  WDWN in NAD; vital signs documented above Gait: Not  observed HENT: WNL, normocephalic Pulmonary: normal non-labored breathing without wheezing Cardiac: regular HR; without carotid bruits Abdomen: soft, NT, aortic pulse is not palpable Skin: without rashes Vascular Exam/Pulses:  Right Left  Radial 2+ (normal) 2+ (normal)  DP Brisk monophasic Brisk monophasic  PT Brisk monophasic Brisk monophasic  Peroneal Brisk monophasic Brisk monophasic   Extremities: + stemmer sign    Neurologic: A&O X 3;  moving all extremities equally Psychiatric:  The pt has Normal affect.   Non-Invasive Vascular Imaging:   Venous duplex on 04/02/2022: +--------------+---------+------+-----------+------------+--------+  LEFT         Reflux NoRefluxReflux TimeDiameter cmsComments                          Yes                                   +--------------+---------+------+-----------+------------+--------+  CFV                    yes   >1 second                       +--------------+---------+------+-----------+------------+--------+  FV prox       no                                              +--------------+---------+------+-----------+------------+--------+  FV mid        no                                              +--------------+---------+------+-----------+------------+--------+  FV dist       no                                              +--------------+---------+------+-----------+------------+--------+  Popliteal    no                                              +--------------+---------+------+-----------+------------+--------+  GSV at SFJ    no                           0.623              +--------------+---------+------+-----------+------------+--------+  GSV prox thighno  0.388              +--------------+---------+------+-----------+------------+--------+  GSV mid thigh no                           0.452               +--------------+---------+------+-----------+------------+--------+  GSV dist thighno                           0.465              +--------------+---------+------+-----------+------------+--------+  GSV at knee   no                           0.411              +--------------+---------+------+-----------+------------+--------+  GSV prox calf           yes    >500 ms      0.43              +--------------+---------+------+-----------+------------+--------+  SSV Pop Fossa no                           0.191              +--------------+---------+------+-----------+------------+--------+  SSV prox calf no                           0.223              +--------------+---------+------+-----------+------------+--------+  SSV mid calf  no                           0.297              +--------------+---------+------+-----------+------------+--------+   Summary:  Left:  Thrombus  - No evidence of deep vein thrombosis seen in the left lower extremity, from the common femoral through the popliteal veins.  - No evidence of superficial venous thrombosis in the left lower extremity.   Deep veins  - Deep vein reflux in the CFV.   Superficial veins  -Superficial vein reflux in the GSV at the proximal calf.   Enlarged vascularized lymph node with mixed echoes in the left groin.     Attilio R Matuszak is a 71 y.o. male who presents with: BLE    -pt has brisk monophasic doppler signals bilateral DP/PT/pero.  He does not have claudication, rest pain or non healing wounds. -pt does not have evidence of DVT.  Pt does have venous reflux in the left CFV and the GSV in the proximal calf but does not have any other evidence of venous reflux. -discussed with pt about wearing knee high 15-20 mmHg compression stockings and pt was measured for these today.   Encouraged him to put these on in the am prior to getting out of bed to make it easier to put on.   -lymphedema and  pt with + Stemmer sign. Pt does have lymphedema pumps at home.  He has new pumps that he has not used yet.  I encouraged him to use these at night and see if he can get some relief.   -discussed the importance of leg elevation and how to elevate properly - pt is advised to elevate their legs and a diagram  is given to them to demonstrate for pt to lay flat on their back with knees elevated and slightly bent with their feet higher than their knees, which puts their feet higher than their heart for 15 minutes per day.  If pt cannot lay flat, advised to lay as flat as possible.  -pt is advised to continue as much walking as possible and avoid sitting or standing for long periods of time.  -discussed importance of  exercise and that water aerobics would also be beneficial.  -handout with recommendations given -pt will f/u as needed.  If he does not get relief with this and has trouble with the right leg, he will call for evaluation for that.   Doreatha Massed, [facility] [phone]  Clinic MD:  Edilia Bo

## 2022-04-02 ENCOUNTER — Ambulatory Visit (HOSPITAL_COMMUNITY)
Admission: RE | Admit: 2022-04-02 | Discharge: 2022-04-02 | Disposition: A | Discharge status: Home / Self Care | Payer: Medicare Other | Source: Ambulatory Visit | Attending: Vascular Surgery | Admitting: Vascular Surgery

## 2022-04-02 ENCOUNTER — Ambulatory Visit (INDEPENDENT_AMBULATORY_CARE_PROVIDER_SITE_OTHER): Payer: Medicare Other | Admitting: Physician Assistant

## 2022-04-02 VITALS — BP 158/82 | HR 75 | Temp 98.1°F | Resp 20 | Ht 62.5 in | Wt 140.0 lb

## 2022-04-02 DIAGNOSIS — M7989 Other specified soft tissue disorders: Secondary | ICD-10-CM | POA: Diagnosis not present

## 2022-04-29 DIAGNOSIS — E7800 Pure hypercholesterolemia, unspecified: Secondary | ICD-10-CM | POA: Diagnosis not present

## 2022-04-29 DIAGNOSIS — E1165 Type 2 diabetes mellitus with hyperglycemia: Secondary | ICD-10-CM | POA: Diagnosis not present

## 2022-04-29 DIAGNOSIS — E113313 Type 2 diabetes mellitus with moderate nonproliferative diabetic retinopathy with macular edema, bilateral: Secondary | ICD-10-CM | POA: Diagnosis not present

## 2022-04-29 DIAGNOSIS — I10 Essential (primary) hypertension: Secondary | ICD-10-CM | POA: Diagnosis not present

## 2022-05-01 DIAGNOSIS — E113532 Type 2 diabetes mellitus with proliferative diabetic retinopathy with traction retinal detachment not involving the macula, left eye: Secondary | ICD-10-CM | POA: Diagnosis not present

## 2022-05-01 DIAGNOSIS — H4312 Vitreous hemorrhage, left eye: Secondary | ICD-10-CM | POA: Diagnosis not present

## 2022-05-01 DIAGNOSIS — E113591 Type 2 diabetes mellitus with proliferative diabetic retinopathy without macular edema, right eye: Secondary | ICD-10-CM | POA: Diagnosis not present

## 2022-05-01 DIAGNOSIS — H3582 Retinal ischemia: Secondary | ICD-10-CM | POA: Diagnosis not present

## 2022-05-01 DIAGNOSIS — E113512 Type 2 diabetes mellitus with proliferative diabetic retinopathy with macular edema, left eye: Secondary | ICD-10-CM | POA: Diagnosis not present

## 2022-05-01 DIAGNOSIS — H401131 Primary open-angle glaucoma, bilateral, mild stage: Secondary | ICD-10-CM | POA: Diagnosis not present

## 2022-06-05 DIAGNOSIS — E113591 Type 2 diabetes mellitus with proliferative diabetic retinopathy without macular edema, right eye: Secondary | ICD-10-CM | POA: Diagnosis not present

## 2022-06-05 DIAGNOSIS — H35013 Changes in retinal vascular appearance, bilateral: Secondary | ICD-10-CM | POA: Diagnosis not present

## 2022-06-05 DIAGNOSIS — E113512 Type 2 diabetes mellitus with proliferative diabetic retinopathy with macular edema, left eye: Secondary | ICD-10-CM | POA: Diagnosis not present

## 2022-06-05 DIAGNOSIS — H401131 Primary open-angle glaucoma, bilateral, mild stage: Secondary | ICD-10-CM | POA: Diagnosis not present

## 2022-06-05 DIAGNOSIS — H3582 Retinal ischemia: Secondary | ICD-10-CM | POA: Diagnosis not present

## 2022-06-09 DIAGNOSIS — H4312 Vitreous hemorrhage, left eye: Secondary | ICD-10-CM | POA: Diagnosis not present

## 2022-06-09 DIAGNOSIS — E113532 Type 2 diabetes mellitus with proliferative diabetic retinopathy with traction retinal detachment not involving the macula, left eye: Secondary | ICD-10-CM | POA: Diagnosis not present

## 2022-06-17 DIAGNOSIS — H3582 Retinal ischemia: Secondary | ICD-10-CM | POA: Diagnosis not present

## 2022-06-17 DIAGNOSIS — E113591 Type 2 diabetes mellitus with proliferative diabetic retinopathy without macular edema, right eye: Secondary | ICD-10-CM | POA: Diagnosis not present

## 2022-06-17 DIAGNOSIS — E113512 Type 2 diabetes mellitus with proliferative diabetic retinopathy with macular edema, left eye: Secondary | ICD-10-CM | POA: Diagnosis not present

## 2022-06-17 DIAGNOSIS — H401131 Primary open-angle glaucoma, bilateral, mild stage: Secondary | ICD-10-CM | POA: Diagnosis not present

## 2022-07-15 DIAGNOSIS — E113591 Type 2 diabetes mellitus with proliferative diabetic retinopathy without macular edema, right eye: Secondary | ICD-10-CM | POA: Diagnosis not present

## 2022-07-22 DIAGNOSIS — E113512 Type 2 diabetes mellitus with proliferative diabetic retinopathy with macular edema, left eye: Secondary | ICD-10-CM | POA: Diagnosis not present

## 2022-08-12 DIAGNOSIS — E113591 Type 2 diabetes mellitus with proliferative diabetic retinopathy without macular edema, right eye: Secondary | ICD-10-CM | POA: Diagnosis not present

## 2022-08-19 ENCOUNTER — Ambulatory Visit: Payer: Medicare Other | Admitting: Medical

## 2023-07-22 ENCOUNTER — Emergency Department (HOSPITAL_BASED_OUTPATIENT_CLINIC_OR_DEPARTMENT_OTHER): Payer: Medicare Other

## 2023-07-22 ENCOUNTER — Emergency Department (HOSPITAL_BASED_OUTPATIENT_CLINIC_OR_DEPARTMENT_OTHER)
Admission: EM | Admit: 2023-07-22 | Discharge: 2023-07-23 | Disposition: A | Discharge status: Home / Self Care | Payer: Medicare Other | Attending: Emergency Medicine | Admitting: Emergency Medicine

## 2023-07-22 ENCOUNTER — Other Ambulatory Visit: Payer: Self-pay

## 2023-07-22 DIAGNOSIS — Z79899 Other long term (current) drug therapy: Secondary | ICD-10-CM | POA: Insufficient documentation

## 2023-07-22 DIAGNOSIS — W01198A Fall on same level from slipping, tripping and stumbling with subsequent striking against other object, initial encounter: Secondary | ICD-10-CM | POA: Insufficient documentation

## 2023-07-22 DIAGNOSIS — S0990XA Unspecified injury of head, initial encounter: Secondary | ICD-10-CM

## 2023-07-22 DIAGNOSIS — Z7984 Long term (current) use of oral hypoglycemic drugs: Secondary | ICD-10-CM | POA: Diagnosis not present

## 2023-07-22 DIAGNOSIS — Z794 Long term (current) use of insulin: Secondary | ICD-10-CM | POA: Diagnosis not present

## 2023-07-22 DIAGNOSIS — R42 Dizziness and giddiness: Secondary | ICD-10-CM

## 2023-07-22 DIAGNOSIS — E119 Type 2 diabetes mellitus without complications: Secondary | ICD-10-CM | POA: Insufficient documentation

## 2023-07-22 DIAGNOSIS — M25562 Pain in left knee: Secondary | ICD-10-CM | POA: Diagnosis not present

## 2023-07-22 DIAGNOSIS — S0081XA Abrasion of other part of head, initial encounter: Secondary | ICD-10-CM | POA: Diagnosis not present

## 2023-07-22 DIAGNOSIS — I6782 Cerebral ischemia: Secondary | ICD-10-CM | POA: Diagnosis not present

## 2023-07-22 DIAGNOSIS — G319 Degenerative disease of nervous system, unspecified: Secondary | ICD-10-CM | POA: Diagnosis not present

## 2023-07-22 DIAGNOSIS — Y92019 Unspecified place in single-family (private) house as the place of occurrence of the external cause: Secondary | ICD-10-CM | POA: Diagnosis not present

## 2023-07-22 DIAGNOSIS — E1165 Type 2 diabetes mellitus with hyperglycemia: Secondary | ICD-10-CM | POA: Diagnosis not present

## 2023-07-22 DIAGNOSIS — M25561 Pain in right knee: Secondary | ICD-10-CM | POA: Diagnosis not present

## 2023-07-22 DIAGNOSIS — R000 Tachycardia, unspecified: Secondary | ICD-10-CM | POA: Insufficient documentation

## 2023-07-22 DIAGNOSIS — R739 Hyperglycemia, unspecified: Secondary | ICD-10-CM

## 2023-07-22 DIAGNOSIS — W19XXXA Unspecified fall, initial encounter: Secondary | ICD-10-CM

## 2023-07-22 LAB — CBC WITH DIFFERENTIAL/PLATELET
Abs Immature Granulocytes: 0.03 10*3/uL (ref 0.00–0.07)
Basophils Absolute: 0.1 10*3/uL (ref 0.0–0.1)
Basophils Relative: 2 %
Eosinophils Absolute: 0.3 10*3/uL (ref 0.0–0.5)
Eosinophils Relative: 4 %
HCT: 38 % — ABNORMAL LOW (ref 39.0–52.0)
Hemoglobin: 13.6 g/dL (ref 13.0–17.0)
Immature Granulocytes: 0 %
Lymphocytes Relative: 14 %
Lymphs Abs: 1 10*3/uL (ref 0.7–4.0)
MCH: 30.2 pg (ref 26.0–34.0)
MCHC: 35.8 g/dL (ref 30.0–36.0)
MCV: 84.3 fL (ref 80.0–100.0)
Monocytes Absolute: 0.3 10*3/uL (ref 0.1–1.0)
Monocytes Relative: 4 %
Neutro Abs: 5.6 10*3/uL (ref 1.7–7.7)
Neutrophils Relative %: 76 %
Platelets: 213 10*3/uL (ref 150–400)
RBC: 4.51 MIL/uL (ref 4.22–5.81)
RDW: 11.9 % (ref 11.5–15.5)
WBC: 7.3 10*3/uL (ref 4.0–10.5)
nRBC: 0 % (ref 0.0–0.2)

## 2023-07-22 LAB — COMPREHENSIVE METABOLIC PANEL
ALT: 42 U/L (ref 0–44)
AST: 46 U/L — ABNORMAL HIGH (ref 15–41)
Albumin: 3.6 g/dL (ref 3.5–5.0)
Alkaline Phosphatase: 95 U/L (ref 38–126)
Anion gap: 14 (ref 5–15)
BUN: 12 mg/dL (ref 8–23)
CO2: 21 mmol/L — ABNORMAL LOW (ref 22–32)
Calcium: 8.9 mg/dL (ref 8.9–10.3)
Chloride: 95 mmol/L — ABNORMAL LOW (ref 98–111)
Creatinine, Ser: 0.66 mg/dL (ref 0.61–1.24)
GFR, Estimated: >60 mL/min (ref >60)
Glucose, Bld: 574 mg/dL (ref 70–99)
Potassium: 3.6 mmol/L (ref 3.5–5.1)
Sodium: 130 mmol/L — ABNORMAL LOW (ref 135–145)
Total Bilirubin: 0.6 mg/dL (ref 0.0–1.2)
Total Protein: 6.4 g/dL — ABNORMAL LOW (ref 6.5–8.1)

## 2023-07-22 MED ORDER — SODIUM CHLORIDE 0.9 % IV BOLUS
1000.0000 mL | Freq: Once | INTRAVENOUS | Status: AC
  Administered 2023-07-22: 1000 mL via INTRAVENOUS

## 2023-07-22 MED ORDER — ACETAMINOPHEN 500 MG PO TABS
1000.0000 mg | ORAL_TABLET | Freq: Once | ORAL | Status: AC
  Administered 2023-07-22: 1000 mg via ORAL
  Filled 2023-07-22: qty 2

## 2023-07-22 MED ORDER — ACETAMINOPHEN 500 MG PO TABS: 1000.0000 mg | ORAL_TABLET | Freq: Every day | ORAL | Status: DC

## 2023-07-22 NOTE — ED Notes (Signed)
 Patient transported to CT

## 2023-07-22 NOTE — ED Triage Notes (Signed)
 Pt sts he was getting out of the car when his "foot locked" causing him to fall. Went to get up, got dizzy, and fell a second time. No LOC. No blood thinner. Presents with abrasion to forehead and C/o bilateral knee pain.

## 2023-07-22 NOTE — ED Provider Notes (Signed)
 [facility] Provider Note   CSN: [id number] Arrival date & time: 07/22/23  2208     History  Chief Complaint  Patient presents with   Philip Holt is a 73 y.o. male.  Patient to ED after fall at home. He had just arrived home and had gotten out of his car and became dizzy. He leaned against the car but then fell to his knees, then forward hitting his head on the driveway. He attempted to crawl up to the house but was unable. Per his wife, he was outside for close to an hour before family found him. No nausea, vomiting, chest pain, abdominal pain. He is not anticoagulated. He has a history of diabetes and reports he has been out of his insulin for an unknown amount of time.   The history is provided by the patient and the spouse. No language interpreter was used.  Fall       Home Medications Prior to Admission medications   Medication Sig Start Date End Date Taking? Authorizing Provider  atorvastatin (LIPITOR) 40 MG tablet Take 40 mg by mouth daily. 06/15/19   [provider]  cholecalciferol (VITAMIN D3) 25 MCG (1000 UNIT) tablet Take 1,000 Units by mouth daily.    [provider]  co-enzyme Q-10 30 MG capsule Take 30 mg by mouth 3 (three) times daily.    [provider]  furosemide (LASIX) 20 MG tablet Take 1 tablet (20 mg total) by mouth daily. 02/26/22   Alvira Monday, MD  insulin lispro (HUMALOG) 100 UNIT/ML injection Inject 0.05 mLs (5 Units total) into the skin 3 (three) times daily before meals. 09/27/14   Tysinger, Kermit Balo, PA-C  LANTUS SOLOSTAR 100 UNIT/ML Solostar Pen  03/09/19   [provider]  LEVEMIR FLEXPEN 100 UNIT/ML FlexPen SMARTSIG:15 Unit(s) SUB-Q Daily 01/21/22   [provider]  lisinopril (ZESTRIL) 10 MG tablet Take 10 mg by mouth daily. 01/19/22   [provider]  metFORMIN (GLUCOPHAGE) 500 MG tablet Take 500 mg by mouth 2 (two) times daily with a  meal.    [provider]  mupirocin cream (BACTROBAN) 2 % Apply 1 Application topically 2 (two) times daily. 02/26/22   Alvira Monday, MD  naproxen (NAPROSYN) 500 MG tablet Take 1 tablet (500 mg total) by mouth 2 (two) times daily as needed for mild pain, moderate pain or headache (TAKE WITH MEALS.). 10/15/15   Street, [place], PA-C  Omega-3 300 MG CAPS Take by mouth.    [provider]  potassium chloride (KLOR-CON M) 10 MEQ tablet Take 10 mEq by mouth daily. 03/02/22   [provider]  SYNJARDY XR 25-1000 MG TB24 Take 1 tablet by mouth daily. 03/31/22   [provider]      Allergies    Patient has no known allergies.    Review of Systems   Review of Systems  Physical Exam Updated Vital Signs BP (!) 155/84 (BP Location: Right Arm)   Pulse (!) 108   Temp (!) 96.4 F (35.8 C) (Tympanic)   Resp 19   SpO2 100%  Physical Exam Vitals and nursing note reviewed.  Constitutional:      Appearance: He is well-developed.  HENT:     Head: Normocephalic.  Eyes:     Pupils: Pupils are equal, round, and reactive to light.  Neck:     Comments: No cervical midline tenderness.  Cardiovascular:     Rate  and Rhythm: Normal rate and regular rhythm.     Heart sounds: No murmur heard. Pulmonary:     Effort: Pulmonary effort is normal.     Breath sounds: Normal breath sounds. No wheezing, rhonchi or rales.  Abdominal:     Palpations: Abdomen is soft.     Tenderness: There is no abdominal tenderness. There is no guarding or rebound.  Musculoskeletal:        General: Normal range of motion.     Cervical back: Normal range of motion and neck supple.     Comments: No bony deformities. Moves UE's in full range. Moves LE's in full range. Tender bilateral anterior knees. There is 2-3+ pitting edema bilaterally.   Skin:    General: Skin is warm and dry.     Comments: Abrasion to forehead without laceration or hematoma.   Neurological:     General: No focal  deficit present.     Mental Status: He is alert and oriented to person, place, and time.     ED Results / Procedures / Treatments   Labs (all labs ordered are listed, but only abnormal results are displayed) Labs Reviewed  CBC WITH DIFFERENTIAL/PLATELET - Abnormal; Notable for the following components:      Result Value   HCT 38.0 (*)    All other components within normal limits  COMPREHENSIVE METABOLIC PANEL  URINALYSIS, ROUTINE W REFLEX MICROSCOPIC    EKG EKG Interpretation Date/Time:  Thursday July 22 2023 22:52:45 EST Ventricular Rate:  106 PR Interval:  184 QRS Duration:  89 QT Interval:  328 QTC Calculation: 436 R Axis:   13  Text Interpretation: Sinus or ectopic atrial tachycardia Probable anteroseptal infarct, old Borderline repolarization abnormality No significant change since last tracing Confirmed by Gilda Crease [phone]) on 07/22/2023 11:15:07 PM  Radiology No results found.  Procedures Procedures    Medications Ordered in ED Medications  acetaminophen (TYLENOL) tablet 1,000 mg (has no administration in time range)  sodium chloride 0.9 % bolus 1,000 mL (1,000 mLs Intravenous New Bag/Given 07/22/23 2305)    ED Course/ Medical Decision Making/ A&P Clinical Course as of 07/22/23 2326  Thu Jul 22, 2023  2240 GLF. Elderly. Presyncopal.  [CC]  2324 Patient to ED after getting dizzy which caused a fall with minor head injury, and bilateral knee pain. Reports he is out of insulin and has been for some time. Tachycardic on arrival. Dexcom reading "high" per patient and family.   Patient care signed out to Dr. Blinda Leatherwood pending labs and imaging for appropriate disposition.  [SU]    Clinical Course User Index [CC] Glyn Ade, MD [SU] Elpidio Anis, PA-C                                 Medical Decision Making Amount and/or Complexity of Data Reviewed Labs: ordered. Radiology: ordered.  Risk OTC drugs.           Final Clinical  Impression(s) / ED Diagnoses Final diagnoses:  Fall, initial encounter  Minor head injury, initial encounter  Dizziness    Rx / DC Orders ED Discharge Orders     None         Elpidio Anis, PA-C 07/22/23 2326    Gilda Crease, MD 07/23/23 0140

## 2023-07-23 DIAGNOSIS — S0081XA Abrasion of other part of head, initial encounter: Secondary | ICD-10-CM | POA: Diagnosis not present

## 2023-07-23 LAB — URINALYSIS, ROUTINE W REFLEX MICROSCOPIC
Bilirubin Urine: NEGATIVE
Glucose, UA: >=500 mg/dL — AB
Ketones, ur: 15 mg/dL — AB
Leukocytes,Ua: NEGATIVE
Nitrite: NEGATIVE
Protein, ur: 100 mg/dL — AB
Specific Gravity, Urine: 1.015 (ref 1.005–1.030)
pH: 7 (ref 5.0–8.0)

## 2023-07-23 LAB — URINALYSIS, MICROSCOPIC (REFLEX)

## 2023-07-23 LAB — CBG MONITORING, ED: Glucose-Capillary: 290 mg/dL — ABNORMAL HIGH (ref 70–99)

## 2023-07-23 MED ORDER — INSULIN ASPART 100 UNIT/ML IJ SOLN
10.0000 [IU] | Freq: Once | INTRAMUSCULAR | Status: AC
  Administered 2023-07-23: 10 [IU] via INTRAVENOUS

## 2023-07-23 NOTE — ED Notes (Signed)
 Assisted pt to stand to urinate. He was unsteady on his feet but states that he uses his walker at home and leans against the wall in the bathroom.

## 2023-07-23 NOTE — ED Notes (Signed)
 BG 290

## 2023-07-30 DIAGNOSIS — E11319 Type 2 diabetes mellitus with unspecified diabetic retinopathy without macular edema: Secondary | ICD-10-CM | POA: Diagnosis not present

## 2023-07-30 DIAGNOSIS — Z794 Long term (current) use of insulin: Secondary | ICD-10-CM | POA: Diagnosis not present

## 2023-07-30 DIAGNOSIS — E785 Hyperlipidemia, unspecified: Secondary | ICD-10-CM | POA: Diagnosis not present

## 2023-07-30 DIAGNOSIS — E1165 Type 2 diabetes mellitus with hyperglycemia: Secondary | ICD-10-CM | POA: Diagnosis not present

## 2023-07-30 DIAGNOSIS — R600 Localized edema: Secondary | ICD-10-CM | POA: Diagnosis not present

## 2023-07-30 DIAGNOSIS — I10 Essential (primary) hypertension: Secondary | ICD-10-CM | POA: Diagnosis not present

## 2023-07-30 DIAGNOSIS — Z9181 History of falling: Secondary | ICD-10-CM | POA: Diagnosis not present

## 2023-07-30 DIAGNOSIS — Z1330 Encounter for screening examination for mental health and behavioral disorders, unspecified: Secondary | ICD-10-CM | POA: Diagnosis not present

## 2023-08-13 DIAGNOSIS — E1165 Type 2 diabetes mellitus with hyperglycemia: Secondary | ICD-10-CM | POA: Diagnosis not present

## 2023-08-13 DIAGNOSIS — Z9181 History of falling: Secondary | ICD-10-CM | POA: Diagnosis not present

## 2023-08-13 DIAGNOSIS — Z5320 Procedure and treatment not carried out because of patient's decision for unspecified reasons: Secondary | ICD-10-CM | POA: Diagnosis not present

## 2023-08-13 DIAGNOSIS — Z794 Long term (current) use of insulin: Secondary | ICD-10-CM | POA: Diagnosis not present

## 2023-08-13 DIAGNOSIS — E11319 Type 2 diabetes mellitus with unspecified diabetic retinopathy without macular edema: Secondary | ICD-10-CM | POA: Diagnosis not present

## 2023-08-13 DIAGNOSIS — Z87891 Personal history of nicotine dependence: Secondary | ICD-10-CM | POA: Diagnosis not present

## 2023-09-10 DIAGNOSIS — Z794 Long term (current) use of insulin: Secondary | ICD-10-CM | POA: Diagnosis not present

## 2023-09-10 DIAGNOSIS — E1165 Type 2 diabetes mellitus with hyperglycemia: Secondary | ICD-10-CM | POA: Diagnosis not present

## 2023-09-10 DIAGNOSIS — Z5982 Transportation insecurity: Secondary | ICD-10-CM | POA: Diagnosis not present

## 2023-09-10 DIAGNOSIS — R296 Repeated falls: Secondary | ICD-10-CM | POA: Diagnosis not present

## 2023-09-10 NOTE — Progress Notes (Signed)
 Subjective   HPI:  Philip Holt is a 73 y.o.  male who presents to the office with:  Chief Complaint  Patient presents with  . Diabetes   Patient presents today for blood glucose log review.  Patient reports that he has been continuing to check routinely with his continuous glucose monitor.  Unfortunately patient did not initiate Ozempic as he was having some questions about administering the medication.  He was able to pick up the medicine from the pharmacy but has not utilized.  He reports that his morning sugars are well-controlled but his evening sugars remain elevated despite use of Lantus  30 units daily.  Overall patient states that he feels well.  He feels as though he is getting stronger but he has had multiple falls since our last office visit.  He states that he felt dizzy when going to the mailbox and fell onto his driveway.  He initially had some back pain but has otherwise recovered.  I have previously discussed with patient possibility for PT evaluation.  He continues to report transportation is an issue and barrier to why he does not wish to establish care with physical therapy team.  He is also hesitant as he has tried PT in the past with limited response.     PMH: Past medical history, Past surgical history, Social history, family history were reviewed as noted in EMR.  Pertinent for:  Past Medical History:  Diagnosis Date  . Anxiety   . Arthritis   . Diabetes mellitus (*)   . GERD (gastroesophageal reflux disease)   . Hyperlipidemia   . Hypertension      Medications and allergies reviewed.  ROS: All systems were negative including constitutional, CV, respiratory, GI/GU except for those noted in the HPI.  Objective   Vital Signs: BP 122/64 (BP Location: Left Upper Arm, Patient Position: Sitting)   Pulse 80   Temp 97.8 F (36.6 C) (Skin)   Resp 16   Ht 5' 2.5 (1.588 m)   Wt 132 lb 12.8 oz (60.2 kg)   SpO2 99%   BMI 23.90 kg/m   Wt Readings from Last 3  Encounters:  09/10/23 132 lb 12.8 oz (60.2 kg)  08/13/23 120 lb 9.6 oz (54.7 kg)  07/30/23 125 lb 3.2 oz (56.8 kg)   Physical Exam Vitals and nursing note reviewed.  Constitutional:      General: He is not in acute distress. HENT:     Head: Normocephalic and atraumatic.  Cardiovascular:     Rate and Rhythm: Normal rate and regular rhythm.     Pulses:          Radial pulses are 2+ on the right side and 2+ on the left side.     Heart sounds: No murmur heard. Pulmonary:     Effort: Pulmonary effort is normal.     Breath sounds: Normal breath sounds. No wheezing.  Musculoskeletal:     Right lower leg: No edema.     Left lower leg: No edema.  Skin:    General: Skin is warm and dry.  Neurological:     Mental Status: He is alert.     Gait: Gait abnormal (guarded; ambulating without assistance).  Psychiatric:        Mood and Affect: Mood and affect normal.        Cognition and Memory: Memory normal.        Judgment: Judgment normal.      Assessment/Plan   Orders Placed This  Encounter  Procedures  . Albumin/Creatinine Ratio, Random Urine  . Ambulatory referral to Care Coordinates Social Worker    1. Type 2 diabetes mellitus with hyperglycemia, with long-term current use of insulin  (*)   2. Frequent falls   3. Transportation insecurity     Type 2 diabetes mellitus with hyperglycemia, with long-term current use of insulin   (*) Patient not due for A1c.  Home glucose readings have been better controlled in the morning but unfortunately spike greater than 200 consistently in the evenings.  He is currently taking Lantus  30 units once daily and tolerating well.  Metformin  1000 mg daily is the maximum tolerated.  At last office visit we introduced Ozempic 0.25 mg.  Patient was able to obtain but did not start as he was confused on administration.  Counseled in office today but not administered as he would like to start on Sundays.  I encourage patient to contact our office if he is  having any questions in regards to his medications and future so that we may address them more timely.  He will continue monitoring closely with his CGM.  Frequent falls  Patient continues to have frequent falls.  I offered PT evaluation.  Patient declines due to concerns regarding transportation.  I have placed a social work referral to assist with transportation to help alleviate these concerns.  I also discussed home health but patient is not interested at this time.  He will continue walking with significant caution.  I encouraged him to walk with a walking device (not utilizing today) to assist with balance.  He has no other focal neurological deficits  Transportation insecurity  Referral today to social work to assist with transportation.  Patient does not have a license and relies on family to assist,  Which does appear to be a hindrance to his healthcare.   Follow up in about 4 weeks (around 10/08/2023) for DM f/u w/ labs.  Future Appointments  Date Time Provider Department Center  10/08/2023  1:30 PM Lannie Gell, PA-C [facility] None    Medications at end of visit today:  Current Outpatient Medications:  .  atorvastatin (LIPITOR) 40 mg tablet, Take one tablet (40 mg dose) by mouth daily., Disp: 30 tablet, Rfl: 2 .  Coenzyme Q10 (COQ10) 100 MG CAPS, Take by mouth., Disp: , Rfl:  .  Cyanocobalamin (VITAMIN B-12 CR PO), Take by mouth., Disp: , Rfl:  .  Empagliflozin-metFORMIN  HCl ER (SYNJARDY XR) 25-1000 MG TB24 tablet, Take one tablet by mouth daily. TAKE IN MORNING WITH BREAKFAST, Disp: 30 tablet, Rfl: 2 .  fish oil 1000 mg CAPS, Take one capsule (1,000 mg dose) by mouth daily., Disp: , Rfl:  .  furosemide  (LASIX ) 20 mg tablet, Take one tablet (20 mg dose) by mouth daily. IN MORNING, Disp: 30 tablet, Rfl: 2 .  Insulin  Glargine (LANTUS  SOLOSTAR) 100 UNIT/ML SOPN, Inject thirty Units into the skin daily. Can increase 2 units every 2-3 days if fasting glucose is > 130., Disp: 15 mL, Rfl:  1 .  lisinopril  (PRINIVIL ,ZESTRIL ) 10 mg tablet, Take one tablet (10 mg dose) by mouth every morning. For blood pressure, Disp: 30 tablet, Rfl: 2 .  metformin  (GLUCOPHAGE ) 1000 MG tablet, Take one tablet (1,000 mg dose) by mouth daily. Take with dinner, Disp: 30 tablet, Rfl: 2 .  Multiple Vitamins-Minerals (ICAPS AREDS 2) CAPS, Take by mouth., Disp: , Rfl:  .  Multiple Vitamins-Minerals (PRESERVISION AREDS PO), Take by mouth., Disp: , Rfl:  .  mupirocin  (BACTROBAN ) 2 % ointment, Apply to affected area 3 times daily, Disp: 30 g, Rfl: 2 .  semaglutide,0.25 or 0.5 mg/dose, (OZEMPIC) 2 mg/3 mL SOPN pen injection, Inject 0.25 mg into the skin once a week., Disp: 3 mL, Rfl: 1  Patient Care Team: Lannie Gell, PA-C as PCP - General (Family Medicine) Alm MALVA Ahle, MD as Consulting Physician (Urology)

## 2023-10-08 DIAGNOSIS — R296 Repeated falls: Secondary | ICD-10-CM | POA: Diagnosis not present

## 2023-10-08 DIAGNOSIS — E1165 Type 2 diabetes mellitus with hyperglycemia: Secondary | ICD-10-CM | POA: Diagnosis not present

## 2023-10-08 DIAGNOSIS — Z794 Long term (current) use of insulin: Secondary | ICD-10-CM | POA: Diagnosis not present

## 2023-10-08 DIAGNOSIS — I10 Essential (primary) hypertension: Secondary | ICD-10-CM | POA: Diagnosis not present

## 2023-11-11 DIAGNOSIS — I10 Essential (primary) hypertension: Secondary | ICD-10-CM | POA: Diagnosis not present

## 2023-11-11 DIAGNOSIS — Z794 Long term (current) use of insulin: Secondary | ICD-10-CM | POA: Diagnosis not present

## 2023-11-11 DIAGNOSIS — E1165 Type 2 diabetes mellitus with hyperglycemia: Secondary | ICD-10-CM | POA: Diagnosis not present

## 2023-11-11 DIAGNOSIS — Z0000 Encounter for general adult medical examination without abnormal findings: Secondary | ICD-10-CM | POA: Diagnosis not present

## 2023-11-11 DIAGNOSIS — Z5320 Procedure and treatment not carried out because of patient's decision for unspecified reasons: Secondary | ICD-10-CM | POA: Diagnosis not present

## 2023-11-11 DIAGNOSIS — R600 Localized edema: Secondary | ICD-10-CM | POA: Diagnosis not present

## 2023-11-11 DIAGNOSIS — E785 Hyperlipidemia, unspecified: Secondary | ICD-10-CM | POA: Diagnosis not present

## 2023-11-11 DIAGNOSIS — R296 Repeated falls: Secondary | ICD-10-CM | POA: Diagnosis not present

## 2024-02-21 DIAGNOSIS — R5383 Other fatigue: Secondary | ICD-10-CM | POA: Diagnosis not present

## 2024-02-21 DIAGNOSIS — I10 Essential (primary) hypertension: Secondary | ICD-10-CM | POA: Diagnosis not present

## 2024-02-21 DIAGNOSIS — E11319 Type 2 diabetes mellitus with unspecified diabetic retinopathy without macular edema: Secondary | ICD-10-CM | POA: Diagnosis not present

## 2024-02-21 DIAGNOSIS — Z794 Long term (current) use of insulin: Secondary | ICD-10-CM | POA: Diagnosis not present

## 2024-02-21 DIAGNOSIS — E1169 Type 2 diabetes mellitus with other specified complication: Secondary | ICD-10-CM | POA: Diagnosis not present

## 2024-02-21 NOTE — Progress Notes (Addendum)
 Subjective   HPI:  Philip Holt is a 73 y.o.  male who presents to the office with:  Chief Complaint  Patient presents with  . Follow-up    Diabetes follow up. Patient reports not having energy. Wife is out of the country and having trouble cooking for himself. Additional concerns of back pain.  . Gaps In Care    Diabetes eye exam// patient will schedule Pneumococcal vaccine// declined Influenza vaccine// declined Tdap vaccine// advised to receive at the pharmacy Zoster vaccine// advised to receive at the pharmacy Colorectal cancer screening// declined   Seen today for work in visit. Patient originally scheduled for 9/23 but confused his appt date. Walked in and requested visit today. Patient was obliged.   Diabetes Follow-up  Hemoglobin A1c  Date Value Ref Range Status  02/21/2024 6.4 (A) 4.8 - 5.6 % Final  11/11/2023 7.0 (A) 4.8 - 5.6 % Final  07/30/2023 13.0 (A) 4.8 - 5.6 % Final     Diabetic Medications     Antidiabetic Combinations     * Empagliflozin-metFORMIN  HCl ER (SYNJARDY XR) 25-1000 MG TB24 tablet Take one tablet by mouth daily. TAKE IN MORNING WITH BREAKFAST     Biguanides     * metformin  (GLUCOPHAGE ) 1000 MG tablet TAKE ONE TABLET (1,000 MG DOSE) BY MOUTH DAILY. TAKE WITH DINNER     Incretin Mimetic Agents     * semaglutide, 1 mg/dose, (OZEMPIC) 4 mg/3 mL SOPN pen Inject 1 mg into the skin once a week.     Insulin      * Insulin  Glargine (LANTUS  SOLOSTAR) 100 UNIT/ML SOPN INJECT THIRTY UNITS INTO THE SKIN DAILY. CAN INCREASE 2 UNITS EVERY 2-3 DAYS IF FASTING GLUCOSE IS > 130.       Health Maintenance  Topic Date Due  . Diabetes Eye Exam  06/11/2016  . Influenza Vaccine (1) 01/31/2024  . Diabetes Foot Exam  05/14/2024  . Diabetes Annual Microalbumin/Creatinine Ratio  06/11/2024  . Diabetes Hemoglobin A1C  08/20/2024  . Diabetes Lipid Profile  11/10/2024    Home glucometer readings are performed regularly.   Glucose levels are consistently low.     Compliance with: diabetic diet: compliant most of the time    medication:   compliant all of the time    exercise:  noncompliant much of the time Body mass index is 23.58 kg/m. Wt Readings from Last 3 Encounters:  02/21/24 131 lb (59.4 kg)  11/11/23 128 lb (58.1 kg)  10/08/23 135 lb 9.6 oz (61.5 kg)    Diabetes symptoms:  blurry vision.  Known diabetic complications:  retinopathy     PMH: Past medical history, Past surgical history, Social history, family history were reviewed as noted in EMR.  Pertinent for:  Past Medical History:  Diagnosis Date  . Anxiety   . Arthritis   . Diabetes mellitus (*)   . GERD (gastroesophageal reflux disease)   . Hyperlipidemia   . Hypertension      Medications and allergies reviewed.  ROS: All systems were negative including constitutional, CV, respiratory, GI/GU except for those noted in the HPI.  Objective   Vital Signs: BP 116/70 (BP Location: Left Upper Arm, Patient Position: Sitting)   Pulse 88   Temp 97.5 F (36.4 C) (Temporal)   Resp 18   Wt 131 lb (59.4 kg)   SpO2 98%   BMI 23.58 kg/m   Wt Readings from Last 3 Encounters:  02/21/24 131 lb (59.4 kg)  11/11/23 128 lb (58.1 kg)  10/08/23 135 lb 9.6 oz (61.5 kg)   Physical Exam Vitals and nursing note reviewed.  Constitutional:      General: He is not in acute distress. HENT:     Head: Normocephalic and atraumatic.  Cardiovascular:     Rate and Rhythm: Normal rate and regular rhythm.     Pulses:          Radial pulses are 2+ on the right side and 2+ on the left side.     Heart sounds: No murmur heard. Pulmonary:     Effort: Pulmonary effort is normal.     Breath sounds: Normal breath sounds. No wheezing.  Skin:    General: Skin is warm and dry.  Neurological:     Mental Status: He is alert.  Psychiatric:        Mood and Affect: Mood and affect normal.        Cognition and Memory: Memory normal.        Judgment: Judgment normal.      Assessment/Plan    Orders Placed This Encounter  Procedures  . POCT Hemoglobin A1C    1. Type 2 diabetes mellitus with other specified complication, with long-term current use of insulin  (*)   2. Essential hypertension   3. Diabetic retinopathy associated with type 2 diabetes mellitus, macular edema presence unspecified, unspecified laterality, unspecified retinopathy severity (*)   4. Fatigue, unspecified type     Type 2 diabetes mellitus, with long-term current use of insulin  (*) A1c improved to 6.4%.  Patient reports hypoglycemic readings consistently.  Patient did not decrease his insulin  as previously recommended.  Written instructions were provided to patient in office to discontinue his Lantus .  We will increase his Ozempic as he is tolerating this well.  Patient will continue with  his Synjardy as well as metformin  oral tablets.  I encouraged him to monitor glucose readings closely.  If he continues to have difficulty with glucose stability, he will let me know ASAP.  Essential hypertension  Blood pressure well-controlled.  No ACS symptoms including chest pain, shortness of breath, palpitations.  Patient does have nonspecific fatigue.  Diabetic retinopathy associated with type 2 diabetes mellitus  (*)  Has not followed up with his retinal specialist. He will call to schedule ASAP as he is experiencing a chronic blurry vision. No acute changes.  4.  Nonspecific fatigue lasting over the last 2 months.  No acute changes.  Patient reports that a few weeks ago he had some left-sided weakness in his leg but this has since resolved.  He did not did not have any other focal neurological deficits.  He has some chronic vision changes but these are unchanged.  I did offer patient further imaging, blood work but patient declines at this time.  He does believe that is related to his hypoglycemic sugar readings.  We will adjust accordingly.  Follow-up if any symptoms change or worsen.  Patient states understanding  and is agreeable to plan  Follow up in about 3 months (around 05/22/2024) for DM f/u (30 mins).  Future Appointments  Date Time Provider Department Center  02/21/2024  4:45 PM Lannie Gell, PA-C [facility] None  05/19/2024 11:30 AM Niall Moreira, PA-C [facility] None    Medications at end of visit today: Current Medications[1]  Patient Care Team: Lannie Gell, PA-C as PCP - General (Family Medicine) Alm Dusty Ahle, MD as Consulting Physician (Urology)          [1]  Current Outpatient  Medications:  .  atorvastatin (LIPITOR) 40 mg tablet, TAKE ONE TABLET BY MOUTH DAILY., Disp: 90 tablet, Rfl: 1 .  Coenzyme Q10 (COQ10) 100 MG CAPS, Take by mouth., Disp: , Rfl:  .  Cyanocobalamin (VITAMIN B-12 CR PO), Take by mouth., Disp: , Rfl:  .  Empagliflozin-metFORMIN  HCl ER (SYNJARDY XR) 25-1000 MG TB24 tablet, Take one tablet by mouth daily. TAKE IN MORNING WITH BREAKFAST, Disp: 90 tablet, Rfl: 2 .  fish oil 1000 mg CAPS, Take one capsule (1,000 mg dose) by mouth daily. (Patient not taking: Reported on 10/08/2023), Disp: , Rfl:  .  furosemide  (LASIX ) 20 mg tablet, TAKE 1 TABLET BY MOUTH EVERY MORNING, Disp: 90 tablet, Rfl: 1 .  Insulin  Glargine (LANTUS  SOLOSTAR) 100 UNIT/ML SOPN, INJECT THIRTY UNITS INTO THE SKIN DAILY. CAN INCREASE 2 UNITS EVERY 2-3 DAYS IF FASTING GLUCOSE IS > 130., Disp: 15 each, Rfl: 1 .  Insulin  Pen Needle (BD,UNIFINE) 31G X 5 MM MISC, one each by Does not apply route daily., Disp: 100 each, Rfl: 11 .  lisinopril  (PRINIVIL ,ZESTRIL ) 10 mg tablet, TAKE ONE TABLET (10 MG DOSE) BY MOUTH EVERY MORNING. FOR BLOOD PRESSURE, Disp: 90 tablet, Rfl: 1 .  metformin  (GLUCOPHAGE ) 1000 MG tablet, TAKE ONE TABLET (1,000 MG DOSE) BY MOUTH DAILY. TAKE WITH DINNER, Disp: 90 tablet, Rfl: 1 .  Multiple Vitamins-Minerals (ICAPS AREDS 2) CAPS, Take by mouth., Disp: , Rfl:  .  Multiple Vitamins-Minerals (PRESERVISION AREDS PO), Take by mouth., Disp: , Rfl:  .  semaglutide, 1 mg/dose, (OZEMPIC) 4  mg/3 mL SOPN pen, Inject 1 mg into the skin once a week., Disp: 3 mL, Rfl: 2 *Some images could not be shown.

## 2024-03-07 ENCOUNTER — Inpatient Hospital Stay (HOSPITAL_COMMUNITY)
Admission: EM | Admit: 2024-03-07 | Discharge: 2024-03-13 | DRG: 070 | Disposition: A | Discharge status: Skilled Nursing Facility

## 2024-03-07 ENCOUNTER — Emergency Department (HOSPITAL_COMMUNITY)

## 2024-03-07 ENCOUNTER — Other Ambulatory Visit: Payer: Self-pay

## 2024-03-07 ENCOUNTER — Encounter (HOSPITAL_COMMUNITY): Payer: Self-pay

## 2024-03-07 DIAGNOSIS — R197 Diarrhea, unspecified: Secondary | ICD-10-CM | POA: Diagnosis not present

## 2024-03-07 DIAGNOSIS — R41841 Cognitive communication deficit: Secondary | ICD-10-CM | POA: Diagnosis not present

## 2024-03-07 DIAGNOSIS — A419 Sepsis, unspecified organism: Secondary | ICD-10-CM | POA: Diagnosis not present

## 2024-03-07 DIAGNOSIS — E113299 Type 2 diabetes mellitus with mild nonproliferative diabetic retinopathy without macular edema, unspecified eye: Secondary | ICD-10-CM | POA: Diagnosis not present

## 2024-03-07 DIAGNOSIS — Z825 Family history of asthma and other chronic lower respiratory diseases: Secondary | ICD-10-CM

## 2024-03-07 DIAGNOSIS — Z79899 Other long term (current) drug therapy: Secondary | ICD-10-CM | POA: Diagnosis not present

## 2024-03-07 DIAGNOSIS — E876 Hypokalemia: Secondary | ICD-10-CM | POA: Diagnosis present

## 2024-03-07 DIAGNOSIS — D649 Anemia, unspecified: Secondary | ICD-10-CM | POA: Diagnosis present

## 2024-03-07 DIAGNOSIS — R059 Cough, unspecified: Secondary | ICD-10-CM | POA: Diagnosis not present

## 2024-03-07 DIAGNOSIS — R231 Pallor: Secondary | ICD-10-CM | POA: Diagnosis not present

## 2024-03-07 DIAGNOSIS — F39 Unspecified mood [affective] disorder: Secondary | ICD-10-CM | POA: Diagnosis not present

## 2024-03-07 DIAGNOSIS — F4320 Adjustment disorder, unspecified: Secondary | ICD-10-CM | POA: Diagnosis not present

## 2024-03-07 DIAGNOSIS — N179 Acute kidney failure, unspecified: Secondary | ICD-10-CM | POA: Diagnosis not present

## 2024-03-07 DIAGNOSIS — Z8679 Personal history of other diseases of the circulatory system: Secondary | ICD-10-CM

## 2024-03-07 DIAGNOSIS — Z1152 Encounter for screening for COVID-19: Secondary | ICD-10-CM | POA: Diagnosis not present

## 2024-03-07 DIAGNOSIS — I10 Essential (primary) hypertension: Secondary | ICD-10-CM | POA: Diagnosis present

## 2024-03-07 DIAGNOSIS — E519 Thiamine deficiency, unspecified: Secondary | ICD-10-CM | POA: Diagnosis not present

## 2024-03-07 DIAGNOSIS — R29818 Other symptoms and signs involving the nervous system: Secondary | ICD-10-CM | POA: Diagnosis not present

## 2024-03-07 DIAGNOSIS — G9341 Metabolic encephalopathy: Secondary | ICD-10-CM | POA: Diagnosis present

## 2024-03-07 DIAGNOSIS — R7989 Other specified abnormal findings of blood chemistry: Secondary | ICD-10-CM | POA: Diagnosis present

## 2024-03-07 DIAGNOSIS — M6282 Rhabdomyolysis: Secondary | ICD-10-CM | POA: Diagnosis present

## 2024-03-07 DIAGNOSIS — I6523 Occlusion and stenosis of bilateral carotid arteries: Secondary | ICD-10-CM | POA: Diagnosis not present

## 2024-03-07 DIAGNOSIS — E43 Unspecified severe protein-calorie malnutrition: Secondary | ICD-10-CM | POA: Diagnosis present

## 2024-03-07 DIAGNOSIS — E1140 Type 2 diabetes mellitus with diabetic neuropathy, unspecified: Secondary | ICD-10-CM | POA: Diagnosis present

## 2024-03-07 DIAGNOSIS — J189 Pneumonia, unspecified organism: Secondary | ICD-10-CM | POA: Diagnosis not present

## 2024-03-07 DIAGNOSIS — I878 Other specified disorders of veins: Secondary | ICD-10-CM | POA: Diagnosis present

## 2024-03-07 DIAGNOSIS — I4719 Other supraventricular tachycardia: Secondary | ICD-10-CM | POA: Diagnosis present

## 2024-03-07 DIAGNOSIS — M6281 Muscle weakness (generalized): Secondary | ICD-10-CM | POA: Diagnosis not present

## 2024-03-07 DIAGNOSIS — Z794 Long term (current) use of insulin: Secondary | ICD-10-CM

## 2024-03-07 DIAGNOSIS — E1165 Type 2 diabetes mellitus with hyperglycemia: Secondary | ICD-10-CM | POA: Diagnosis present

## 2024-03-07 DIAGNOSIS — Z7982 Long term (current) use of aspirin: Secondary | ICD-10-CM

## 2024-03-07 DIAGNOSIS — Z6821 Body mass index (BMI) 21.0-21.9, adult: Secondary | ICD-10-CM

## 2024-03-07 DIAGNOSIS — I6501 Occlusion and stenosis of right vertebral artery: Secondary | ICD-10-CM | POA: Diagnosis present

## 2024-03-07 DIAGNOSIS — R9431 Abnormal electrocardiogram [ECG] [EKG]: Secondary | ICD-10-CM | POA: Diagnosis present

## 2024-03-07 DIAGNOSIS — I2489 Other forms of acute ischemic heart disease: Secondary | ICD-10-CM | POA: Diagnosis not present

## 2024-03-07 DIAGNOSIS — N39498 Other specified urinary incontinence: Secondary | ICD-10-CM | POA: Diagnosis not present

## 2024-03-07 DIAGNOSIS — E860 Dehydration: Secondary | ICD-10-CM | POA: Diagnosis present

## 2024-03-07 DIAGNOSIS — R441 Visual hallucinations: Secondary | ICD-10-CM | POA: Diagnosis not present

## 2024-03-07 DIAGNOSIS — Z7985 Long-term (current) use of injectable non-insulin antidiabetic drugs: Secondary | ICD-10-CM

## 2024-03-07 DIAGNOSIS — K219 Gastro-esophageal reflux disease without esophagitis: Secondary | ICD-10-CM | POA: Diagnosis not present

## 2024-03-07 DIAGNOSIS — Z83438 Family history of other disorder of lipoprotein metabolism and other lipidemia: Secondary | ICD-10-CM

## 2024-03-07 DIAGNOSIS — R918 Other nonspecific abnormal finding of lung field: Secondary | ICD-10-CM | POA: Diagnosis not present

## 2024-03-07 DIAGNOSIS — I872 Venous insufficiency (chronic) (peripheral): Secondary | ICD-10-CM | POA: Diagnosis not present

## 2024-03-07 DIAGNOSIS — Z7984 Long term (current) use of oral hypoglycemic drugs: Secondary | ICD-10-CM | POA: Diagnosis not present

## 2024-03-07 DIAGNOSIS — R000 Tachycardia, unspecified: Secondary | ICD-10-CM | POA: Diagnosis not present

## 2024-03-07 DIAGNOSIS — E782 Mixed hyperlipidemia: Secondary | ICD-10-CM | POA: Diagnosis not present

## 2024-03-07 DIAGNOSIS — R531 Weakness: Secondary | ICD-10-CM | POA: Diagnosis not present

## 2024-03-07 DIAGNOSIS — Z87891 Personal history of nicotine dependence: Secondary | ICD-10-CM

## 2024-03-07 DIAGNOSIS — E785 Hyperlipidemia, unspecified: Secondary | ICD-10-CM | POA: Diagnosis present

## 2024-03-07 DIAGNOSIS — E1139 Type 2 diabetes mellitus with other diabetic ophthalmic complication: Secondary | ICD-10-CM | POA: Diagnosis not present

## 2024-03-07 DIAGNOSIS — R739 Hyperglycemia, unspecified: Secondary | ICD-10-CM | POA: Diagnosis present

## 2024-03-07 DIAGNOSIS — E872 Acidosis, unspecified: Secondary | ICD-10-CM | POA: Diagnosis not present

## 2024-03-07 DIAGNOSIS — Z28310 Unvaccinated for covid-19: Secondary | ICD-10-CM | POA: Diagnosis not present

## 2024-03-07 DIAGNOSIS — R1312 Dysphagia, oropharyngeal phase: Secondary | ICD-10-CM | POA: Diagnosis not present

## 2024-03-07 DIAGNOSIS — T50916A Underdosing of multiple unspecified drugs, medicaments and biological substances, initial encounter: Secondary | ICD-10-CM | POA: Diagnosis present

## 2024-03-07 DIAGNOSIS — R509 Fever, unspecified: Secondary | ICD-10-CM | POA: Diagnosis not present

## 2024-03-07 DIAGNOSIS — E11319 Type 2 diabetes mellitus with unspecified diabetic retinopathy without macular edema: Secondary | ICD-10-CM | POA: Diagnosis present

## 2024-03-07 DIAGNOSIS — W19XXXA Unspecified fall, initial encounter: Secondary | ICD-10-CM | POA: Diagnosis not present

## 2024-03-07 DIAGNOSIS — T730XXA Starvation, initial encounter: Secondary | ICD-10-CM | POA: Diagnosis present

## 2024-03-07 DIAGNOSIS — R748 Abnormal levels of other serum enzymes: Secondary | ICD-10-CM | POA: Diagnosis not present

## 2024-03-07 DIAGNOSIS — R296 Repeated falls: Secondary | ICD-10-CM | POA: Diagnosis present

## 2024-03-07 DIAGNOSIS — E119 Type 2 diabetes mellitus without complications: Secondary | ICD-10-CM

## 2024-03-07 DIAGNOSIS — F909 Attention-deficit hyperactivity disorder, unspecified type: Secondary | ICD-10-CM | POA: Diagnosis not present

## 2024-03-07 DIAGNOSIS — R627 Adult failure to thrive: Secondary | ICD-10-CM | POA: Diagnosis present

## 2024-03-07 DIAGNOSIS — R32 Unspecified urinary incontinence: Secondary | ICD-10-CM | POA: Diagnosis present

## 2024-03-07 DIAGNOSIS — Z91148 Patient's other noncompliance with medication regimen for other reason: Secondary | ICD-10-CM

## 2024-03-07 DIAGNOSIS — R2689 Other abnormalities of gait and mobility: Secondary | ICD-10-CM | POA: Diagnosis not present

## 2024-03-07 LAB — I-STAT CG4 LACTIC ACID, ED
Lactic Acid, Venous: 2.3 mmol/L (ref 0.5–1.9)
Lactic Acid, Venous: 1.2 mmol/L (ref 0.5–1.9)

## 2024-03-07 LAB — COMPREHENSIVE METABOLIC PANEL WITH GFR
ALT: 20 U/L (ref 0–44)
AST: 28 U/L (ref 15–41)
Albumin: 3.6 g/dL (ref 3.5–5.0)
Alkaline Phosphatase: 72 U/L (ref 38–126)
Anion gap: 12 (ref 5–15)
BUN: 25 mg/dL — ABNORMAL HIGH (ref 8–23)
CO2: 22 mmol/L (ref 22–32)
Calcium: 9.2 mg/dL (ref 8.9–10.3)
Chloride: 103 mmol/L (ref 98–111)
Creatinine, Ser: 1.11 mg/dL (ref 0.61–1.24)
GFR, Estimated: >60 mL/min (ref >60)
Glucose, Bld: 306 mg/dL — ABNORMAL HIGH (ref 70–99)
Potassium: 3.9 mmol/L (ref 3.5–5.1)
Sodium: 137 mmol/L (ref 135–145)
Total Bilirubin: 1.1 mg/dL (ref 0.0–1.2)
Total Protein: 6.1 g/dL — ABNORMAL LOW (ref 6.5–8.1)

## 2024-03-07 LAB — I-STAT CHEM 8, ED
BUN: 28 mg/dL — ABNORMAL HIGH (ref 8–23)
BUN: 23 mg/dL (ref 8–23)
Calcium, Ion: 1.21 mmol/L (ref 1.15–1.40)
Calcium, Ion: 1.24 mmol/L (ref 1.15–1.40)
Chloride: 102 mmol/L (ref 98–111)
Chloride: 102 mmol/L (ref 98–111)
Creatinine, Ser: 0.9 mg/dL (ref 0.61–1.24)
Creatinine, Ser: 0.8 mg/dL (ref 0.61–1.24)
Glucose, Bld: 309 mg/dL — ABNORMAL HIGH (ref 70–99)
Glucose, Bld: 284 mg/dL — ABNORMAL HIGH (ref 70–99)
HCT: 31 % — ABNORMAL LOW (ref 39.0–52.0)
HCT: 33 % — ABNORMAL LOW (ref 39.0–52.0)
Hemoglobin: 10.5 g/dL — ABNORMAL LOW (ref 13.0–17.0)
Hemoglobin: 11.2 g/dL — ABNORMAL LOW (ref 13.0–17.0)
Potassium: 4 mmol/L (ref 3.5–5.1)
Potassium: 3.7 mmol/L (ref 3.5–5.1)
Sodium: 137 mmol/L (ref 135–145)
Sodium: 138 mmol/L (ref 135–145)
TCO2: 24 mmol/L (ref 22–32)
TCO2: 23 mmol/L (ref 22–32)

## 2024-03-07 LAB — CBC
HCT: 33.7 % — ABNORMAL LOW (ref 39.0–52.0)
Hemoglobin: 11.7 g/dL — ABNORMAL LOW (ref 13.0–17.0)
MCH: 29.8 pg (ref 26.0–34.0)
MCHC: 34.7 g/dL (ref 30.0–36.0)
MCV: 85.8 fL (ref 80.0–100.0)
Platelets: 242 K/uL (ref 150–400)
RBC: 3.93 MIL/uL — ABNORMAL LOW (ref 4.22–5.81)
RDW: 12.4 % (ref 11.5–15.5)
WBC: 10.5 K/uL (ref 4.0–10.5)
nRBC: 0 % (ref 0.0–0.2)

## 2024-03-07 LAB — URINALYSIS, ROUTINE W REFLEX MICROSCOPIC
Bacteria, UA: NONE SEEN
Bilirubin Urine: NEGATIVE
Glucose, UA: >=500 mg/dL — AB
Ketones, ur: 20 mg/dL — AB
Leukocytes,Ua: NEGATIVE
Nitrite: NEGATIVE
Protein, ur: NEGATIVE mg/dL
Specific Gravity, Urine: 1.03 (ref 1.005–1.030)
pH: 5 (ref 5.0–8.0)

## 2024-03-07 LAB — RESP PANEL BY RT-PCR (RSV, FLU A&B, COVID)  RVPGX2
Influenza A by PCR: NEGATIVE
Influenza B by PCR: NEGATIVE
Resp Syncytial Virus by PCR: NEGATIVE
SARS Coronavirus 2 by RT PCR: NEGATIVE

## 2024-03-07 LAB — CK: Total CK: 536 U/L — ABNORMAL HIGH (ref 49–397)

## 2024-03-07 LAB — TROPONIN I (HIGH SENSITIVITY)
Troponin I (High Sensitivity): 168 ng/L (ref <18)
Troponin I (High Sensitivity): 158 ng/L (ref <18)

## 2024-03-07 LAB — CBG MONITORING, ED: Glucose-Capillary: 288 mg/dL — ABNORMAL HIGH (ref 70–99)

## 2024-03-07 MED ORDER — IOHEXOL 350 MG/ML SOLN
75.0000 mL | Freq: Once | INTRAVENOUS | Status: AC | PRN
  Administered 2024-03-07: 75 mL via INTRAVENOUS

## 2024-03-07 MED ORDER — MELATONIN 3 MG PO TABS: 3.0000 mg | ORAL_TABLET | Freq: Every evening | ORAL | Status: DC | PRN

## 2024-03-07 MED ORDER — LACTATED RINGERS IV BOLUS
1000.0000 mL | Freq: Once | INTRAVENOUS | Status: AC
  Administered 2024-03-07: 1000 mL via INTRAVENOUS

## 2024-03-07 MED ORDER — ACETAMINOPHEN 650 MG RE SUPP: 650.0000 mg | Freq: Four times a day (QID) | RECTAL | Status: DC | PRN

## 2024-03-07 MED ORDER — PIPERACILLIN-TAZOBACTAM 3.375 G IVPB 30 MIN
3.3750 g | Freq: Once | INTRAVENOUS | Status: AC
  Administered 2024-03-07: 3.375 g via INTRAVENOUS
  Filled 2024-03-07: qty 50

## 2024-03-07 MED ORDER — SODIUM CHLORIDE 0.9 % IV SOLN
500.0000 mg | Freq: Every day | INTRAVENOUS | Status: DC
  Filled 2024-03-07: qty 5

## 2024-03-07 MED ORDER — INSULIN ASPART 100 UNIT/ML IJ SOLN
0.0000 [IU] | Freq: Three times a day (TID) | INTRAMUSCULAR | Status: DC
  Administered 2024-03-08: 2 [IU] via SUBCUTANEOUS
  Administered 2024-03-08: 5 [IU] via SUBCUTANEOUS
  Administered 2024-03-09: 2 [IU] via SUBCUTANEOUS
  Administered 2024-03-09: 5 [IU] via SUBCUTANEOUS
  Administered 2024-03-10: 2 [IU] via SUBCUTANEOUS
  Administered 2024-03-10: 1 [IU] via SUBCUTANEOUS
  Administered 2024-03-10: 3 [IU] via SUBCUTANEOUS
  Administered 2024-03-11: 1 [IU] via SUBCUTANEOUS
  Administered 2024-03-11: 2 [IU] via SUBCUTANEOUS
  Administered 2024-03-11: 3 [IU] via SUBCUTANEOUS
  Administered 2024-03-12 (×2): 5 [IU] via SUBCUTANEOUS
  Administered 2024-03-13: 7 [IU] via SUBCUTANEOUS

## 2024-03-07 MED ORDER — ACETAMINOPHEN 325 MG PO TABS: 650.0000 mg | ORAL_TABLET | Freq: Four times a day (QID) | ORAL | Status: DC | PRN

## 2024-03-07 MED ORDER — SODIUM CHLORIDE 0.9 % IV SOLN
1.0000 g | Freq: Every day | INTRAVENOUS | Status: DC
  Administered 2024-03-08 (×2): 1 g via INTRAVENOUS
  Filled 2024-03-07 (×2): qty 10

## 2024-03-07 MED ORDER — ONDANSETRON HCL 4 MG/2ML IJ SOLN: 4.0000 mg | Freq: Four times a day (QID) | INTRAMUSCULAR | Status: DC | PRN

## 2024-03-07 MED ORDER — LACTATED RINGERS IV SOLN: INTRAVENOUS | Status: AC

## 2024-03-07 MED ORDER — INSULIN GLARGINE 100 UNIT/ML ~~LOC~~ SOLN
5.0000 [IU] | Freq: Every day | SUBCUTANEOUS | Status: DC
  Administered 2024-03-08: 5 [IU] via SUBCUTANEOUS
  Filled 2024-03-07 (×2): qty 0.05

## 2024-03-07 MED ORDER — INSULIN ASPART 100 UNIT/ML IJ SOLN
0.0000 [IU] | Freq: Every day | INTRAMUSCULAR | Status: DC
  Administered 2024-03-08: 3 [IU] via SUBCUTANEOUS
  Administered 2024-03-11: 2 [IU] via SUBCUTANEOUS
  Administered 2024-03-12: 3 [IU] via SUBCUTANEOUS

## 2024-03-07 MED ORDER — ASPIRIN 81 MG PO CHEW
324.0000 mg | CHEWABLE_TABLET | Freq: Once | ORAL | Status: AC
  Administered 2024-03-07: 324 mg via ORAL
  Filled 2024-03-07: qty 4

## 2024-03-07 NOTE — ED Triage Notes (Signed)
 PER EMS: pt is from home with c/o multiple falls today and failure to thrive. Family also reports he has been hallucinating seeing things that aren't there. No head injury, no thinners. + urinary incontinence. He is currently alert and following commands. He received 1L of LR en route.   BP- 160/78, HR-115, 99% RA, CBG-379

## 2024-03-07 NOTE — ED Notes (Signed)
 CCMD called for cardiac monitoring.

## 2024-03-07 NOTE — ED Provider Notes (Signed)
 [place] [facility] AT [facility] Provider Note  CSN: [id number] Arrival date & time: 03/07/24 1747  Chief Complaint(s) Fall  HPI Kyllian R Fambro is a 73 y.o. male who is here today due to multiple falls and reported failure to thrive.  Patient arrives with daughter and son-in-law.  He reportedly has felt dizzy for 1 week, has also had the hiccups.  He was at home, states that he had a fall earlier today.  His daughter who was not here went to check on the patient, and he was on the toilet unable to get up.  He has been increasingly weak at home.  He typically ambulates with a walker.   Past Medical History Past Medical History:  Diagnosis Date   ADD (attention deficit disorder)    evaluated by psych in the past   Chicken pox    Diabetes mellitus type II, uncontrolled    Diabetic retinopathy    Dr Debra proliferative   Erectile dysfunction    Hyperlipidemia    Hypertension    Non-compliance with treatment    Patient Active Problem List   Diagnosis Date Noted   Erectile dysfunction 02/20/2014   Diabetes mellitus with ophthalmic complication (HCC) 12/27/2013   Visit for preventive health examination 06/11/2013   GERD (gastroesophageal reflux disease) 06/11/2013   Adjustment reaction 09/27/2009   Background diabetic retinopathy (HCC) 06/05/2009   Diabetes mellitus type II, uncontrolled 01/12/2008   ONYCHOMYCOSIS, TOENAILS 01/11/2008   HYPERLIPIDEMIA 11/08/2007   ERECTILE DYSFUNCTION 11/08/2007   Essential hypertension 11/08/2007   Home Medication(s) Prior to Admission medications   Medication Sig Start Date End Date Taking? Authorizing Provider  atorvastatin (LIPITOR) 40 MG tablet Take 40 mg by mouth daily. 06/15/19   [provider]  cholecalciferol (VITAMIN D3) 25 MCG (1000 UNIT) tablet Take 1,000 Units by mouth daily.    [provider]  co-enzyme Q-10 30 MG capsule Take 30 mg by mouth 3 (three) times daily.    [provider]  furosemide  (LASIX ) 20 MG tablet Take 1 tablet (20 mg total) by mouth daily. 02/26/22   Dreama Longs, MD  insulin  lispro (HUMALOG ) 100 UNIT/ML injection Inject 0.05 mLs (5 Units total) into the skin 3 (three) times daily before meals. 09/27/14   Tysinger, Alm RAMAN, PA-C  LANTUS  SOLOSTAR 100 UNIT/ML Solostar Pen  03/09/19   [provider]  LEVEMIR  FLEXPEN 100 UNIT/ML FlexPen SMARTSIG:15 Unit(s) SUB-Q Daily 01/21/22   [provider]  lisinopril  (ZESTRIL ) 10 MG tablet Take 10 mg by mouth daily. 01/19/22   [provider]  metFORMIN  (GLUCOPHAGE ) 500 MG tablet Take 500 mg by mouth 2 (two) times daily with a meal.    [provider]  mupirocin  cream (BACTROBAN ) 2 % Apply 1 Application topically 2 (two) times daily. 02/26/22   Dreama Longs, MD  naproxen  (NAPROSYN ) 500 MG tablet Take 1 tablet (500 mg total) by mouth 2 (two) times daily as needed for mild pain, moderate pain or headache (TAKE WITH MEALS.). 10/15/15   Street, [place], PA-C  Omega-3 300 MG CAPS Take by mouth.    [provider]  potassium chloride (KLOR-CON M) 10 MEQ tablet Take 10 mEq by mouth daily. 03/02/22   [provider]  SYNJARDY XR 25-1000 MG TB24 Take 1 tablet by mouth daily. 03/31/22   [provider]  Past Surgical History Past Surgical History:  Procedure Laterality Date   PENILE PROSTHESIS IMPLANT     WISDOM TOOTH EXTRACTION     Family History Family History  Problem Relation Age of Onset   Hyperlipidemia Mother    Asthma Mother    Other Father        Bronchiectasis   Asthma Father    Healthy Daughter        x3   Heart disease Neg Hx    Colon cancer Neg Hx     Social History Social History   Tobacco Use   Smoking status: Former    Current packs/day: 0.00    Types: Cigarettes    Quit date: 04/01/2013     Years since quitting: 10.9    Passive exposure: Never   Smokeless tobacco: Never  Vaping Use   Vaping status: Never Used  Substance Use Topics   Alcohol use: Yes    Comment: rare   Drug use: No   Allergies Patient has no known allergies.  Review of Systems Review of Systems  Physical Exam Vital Signs  I have reviewed the triage vital signs BP 131/82   Pulse (!) 107   Temp 99 F (37.2 C) (Oral)   Resp 16   SpO2 99%   Physical Exam Vitals and nursing note reviewed.  Constitutional:      Comments: Frail  HENT:     Head: Normocephalic and atraumatic.  Eyes:     Pupils: Pupils are equal, round, and reactive to light.  Cardiovascular:     Rate and Rhythm: Tachycardia present.     Pulses: Normal pulses.  Pulmonary:     Effort: Pulmonary effort is normal.  Abdominal:     General: Abdomen is flat.     Palpations: Abdomen is soft.  Musculoskeletal:     Cervical back: Normal range of motion.  Skin:    General: Skin is warm.     Comments: Chronic venous stasis changes in the bilateral lower extremities  Neurological:     General: No focal deficit present.     Mental Status: He is alert and oriented to person, place, and time.  Psychiatric:        Mood and Affect: Mood normal.     ED Results and Treatments Labs (all labs ordered are listed, but only abnormal results are displayed) Labs Reviewed  COMPREHENSIVE METABOLIC PANEL WITH GFR - Abnormal; Notable for the following components:      Result Value   Glucose, Bld 306 (*)    BUN 25 (*)    Total Protein 6.1 (*)    All other components within normal limits  CBC - Abnormal; Notable for the following components:   RBC 3.93 (*)    Hemoglobin 11.7 (*)    HCT 33.7 (*)    All other components within normal limits  URINALYSIS, ROUTINE W REFLEX MICROSCOPIC - Abnormal; Notable for the following components:   Glucose, UA >=500 (*)    Hgb urine dipstick SMALL (*)    Ketones, ur 20 (*)    All other components within  normal limits  CK - Abnormal; Notable for the following components:   Total CK 536 (*)    All other components within normal limits  CBG MONITORING, ED - Abnormal; Notable for the following components:   Glucose-Capillary 288 (*)    All other components within normal limits  I-STAT CHEM 8, ED - Abnormal; Notable for the following components:   BUN 28 (*)  Glucose, Bld 309 (*)    Hemoglobin 10.5 (*)    HCT 31.0 (*)    All other components within normal limits  I-STAT CHEM 8, ED - Abnormal; Notable for the following components:   Glucose, Bld 284 (*)    Hemoglobin 11.2 (*)    HCT 33.0 (*)    All other components within normal limits  I-STAT CG4 LACTIC ACID, ED - Abnormal; Notable for the following components:   Lactic Acid, Venous 2.3 (*)    All other components within normal limits  TROPONIN I (HIGH SENSITIVITY) - Abnormal; Notable for the following components:   Troponin I (High Sensitivity) 168 (*)    All other components within normal limits  RESP PANEL BY RT-PCR (RSV, FLU A&B, COVID)  RVPGX2  CULTURE, BLOOD (ROUTINE X 2)  CULTURE, BLOOD (ROUTINE X 2)  I-STAT CG4 LACTIC ACID, ED  TROPONIN I (HIGH SENSITIVITY)                                                                                                                          Radiology DG Chest 1 View Result Date: 03/07/2024 EXAM: 1 VIEW XRAY OF THE CHEST 03/07/2024 08:42:00 PM COMPARISON: None available. CLINICAL HISTORY: cough. Multiple falls today and failure to thrive. FINDINGS: LUNGS AND PLEURA: Mild patchy opacity of the left lung base. No pulmonary edema. No pleural effusion. No pneumothorax. HEART AND MEDIASTINUM: Normal heart size and mediastinal contours. BONES AND SOFT TISSUES: No acute osseous abnormality. IMPRESSION: 1. Mild patchy left basilar airspace opacity. This may represent atelectasis or infection in the appropriate clinical setting. Electronically signed by: Andrea Gasman MD 03/07/2024 08:49 PM EDT RP  Workstation: [id number]   CT ANGIO HEAD NECK W WO CM Result Date: 03/07/2024 EXAM: CTA HEAD AND NECK WITH AND WITHOUT 03/07/2024 07:44:54 PM TECHNIQUE: CTA of the head and neck was performed with and without the administration of 75 mL of iohexol (OMNIPAQUE) 350 MG/ML injection. Multiplanar 2D and/or 3D reformatted images are provided for review. Automated exposure control, iterative reconstruction, and/or weight based adjustment of the mA/kV was utilized to reduce the radiation dose to as low as reasonably achievable. Stenosis of the internal carotid arteries measured using NASCET criteria. COMPARISON: None available CLINICAL HISTORY: Neuro deficit, acute, stroke suspected. Fall, neuro deficit, acute, stroke suspected. 75 ml omni 350. FINDINGS: CTA NECK: AORTIC ARCH AND ARCH VESSELS: No dissection or arterial injury. No significant stenosis of the brachiocephalic or subclavian arteries. CERVICAL CAROTID ARTERIES: Right carotid bifurcation: Atherosclerosis with 70% stenosis of the proximal ICA. Left carotid bifurcation: Atherosclerosis without greater than 50% stenosis. No dissection or arterial injury. CERVICAL VERTEBRAL ARTERIES: Right vertebral origin: Moderate stenosis. Left vertebral artery: No dissection, arterial injury, or significant stenosis. LUNGS AND MEDIASTINUM: Unremarkable. SOFT TISSUES: No acute abnormality. BONES: No acute abnormality. CTA HEAD: ANTERIOR CIRCULATION: Internal carotid arteries: Severe right and moderate left cavernous ICA stenosis. Anterior cerebral arteries: No significant stenosis. Middle cerebral arteries: No significant stenosis. No aneurysm. POSTERIOR  CIRCULATION: Posterior cerebral arteries: No significant stenosis. Basilar artery: No significant stenosis. Vertebral arteries: No significant stenosis. No aneurysm. OTHER: No dural venous sinus thrombosis on this non-dedicated study. IMPRESSION: 1. No large vessel occlusion. 2. Approximately 70% stenosis of the proximal ICA. 3.  Severe right and moderate left cavernous ICA stenosis. 4. Moderate right vertebral artery origin stenosis. Electronically signed by: Gilmore Molt MD 03/07/2024 08:46 PM EDT RP Workstation: [id number]    Pertinent labs & imaging results that were available during my care of the patient were reviewed by me and considered in my medical decision making (see MDM for details).  Medications Ordered in ED Medications  lactated ringers bolus 1,000 mL (0 mLs Intravenous Stopped 03/07/24 2042)  iohexol (OMNIPAQUE) 350 MG/ML injection 75 mL (75 mLs Intravenous Contrast Given 03/07/24 1944)  aspirin chewable tablet 324 mg (324 mg Oral Given 03/07/24 2039)  piperacillin-tazobactam (ZOSYN) IVPB 3.375 g (0 g Intravenous Stopped 03/07/24 2135)                                                                                                                                     Procedures .Critical Care  Performed by: Mannie Fairy DASEN, DO Authorized by: Mannie Fairy DASEN, DO   Critical care provider statement:    Critical care time (minutes):  33   Critical care was necessary to treat or prevent imminent or life-threatening deterioration of the following conditions:  Sepsis   Critical care was time spent personally by me on the following activities:  Development of treatment plan with patient or surrogate, discussions with consultants, evaluation of patient's response to treatment, examination of patient, ordering and review of laboratory studies, ordering and review of radiographic studies, ordering and performing treatments and interventions, pulse oximetry, re-evaluation of patient's condition and review of old charts   (including critical care time)  Medical Decision Making / ED Course   This patient presents to the ED for concern of fall, weakness, failure to thrive, this involves an extensive number of treatment options, and is a complaint that carries with it a high risk of complications and morbidity.   The differential diagnosis includes underlying infection, ultralight abnormalities, CVA, posterior CVA, consider MI, UTI, electrolyte abnormalities, rhabdomyolysis.  MDM: On exam, patient overall frail appearing.  Mildly tachycardic.  Believe this is likely some dehydration.  His EKG does have some nonspecific ST changes, however with his elevated heart rate I think that this is likely demand ischemia.  Will work on getting his heart rate under control here with some fluids.  Troponin ordered.  With the presence of hiccups and report of dizziness, I do have concern for CVA or posterior CVA.  Blood cultures and lactic acid ordered.  Reassessment 9:30 PM-patient's troponin elevated.  Did reach out to cardiology and spoke to Dr. Loni who agreed his EKG and troponin are most likely being driven by demand with ever the etiology of his  elevated CK was.  Patient's chest x-ray shows possible inflammatory process.  His lactic acid is elevated.  With the patient's lactic acid heart rate, he does meet sepsis criteria.  Out of concern for fluid overload, provided patient with 1 L fluid and reassessed.  Patient's heart rate has come down.  Have started the patient on broad-spectrum antibiotics.  Will admit patient to hospitalist service.   Additional history obtained: -Additional history obtained from family at bedside most recent -External records from outside source obtained and reviewed including: Most recent hospital discharge summary.  Lab Tests: -I ordered, reviewed, and interpreted labs.   The pertinent results include:   Labs Reviewed  COMPREHENSIVE METABOLIC PANEL WITH GFR - Abnormal; Notable for the following components:      Result Value   Glucose, Bld 306 (*)    BUN 25 (*)    Total Protein 6.1 (*)    All other components within normal limits  CBC - Abnormal; Notable for the following components:   RBC 3.93 (*)    Hemoglobin 11.7 (*)    HCT 33.7 (*)    All other components within  normal limits  URINALYSIS, ROUTINE W REFLEX MICROSCOPIC - Abnormal; Notable for the following components:   Glucose, UA >=500 (*)    Hgb urine dipstick SMALL (*)    Ketones, ur 20 (*)    All other components within normal limits  CK - Abnormal; Notable for the following components:   Total CK 536 (*)    All other components within normal limits  CBG MONITORING, ED - Abnormal; Notable for the following components:   Glucose-Capillary 288 (*)    All other components within normal limits  I-STAT CHEM 8, ED - Abnormal; Notable for the following components:   BUN 28 (*)    Glucose, Bld 309 (*)    Hemoglobin 10.5 (*)    HCT 31.0 (*)    All other components within normal limits  I-STAT CHEM 8, ED - Abnormal; Notable for the following components:   Glucose, Bld 284 (*)    Hemoglobin 11.2 (*)    HCT 33.0 (*)    All other components within normal limits  I-STAT CG4 LACTIC ACID, ED - Abnormal; Notable for the following components:   Lactic Acid, Venous 2.3 (*)    All other components within normal limits  TROPONIN I (HIGH SENSITIVITY) - Abnormal; Notable for the following components:   Troponin I (High Sensitivity) 168 (*)    All other components within normal limits  RESP PANEL BY RT-PCR (RSV, FLU A&B, COVID)  RVPGX2  CULTURE, BLOOD (ROUTINE X 2)  CULTURE, BLOOD (ROUTINE X 2)  I-STAT CG4 LACTIC ACID, ED  TROPONIN I (HIGH SENSITIVITY)      EKG sinus tachycardia, demand ischemia.  EKG Interpretation Date/Time:  Tuesday March 07 2024 18:10:18 EDT Ventricular Rate:  119 PR Interval:    QRS Duration:  73 QT Interval:  375 QTC Calculation: 528 R Axis:   56  Text Interpretation: Junctional tachycardia Anteroseptal infarct, age indeterminate Prolonged QT interval Confirmed by Mannie Pac [phone]) on 03/07/2024 8:17:34 PM         Imaging Studies ordered: I ordered imaging studies including CTA head, chest x-ray I independently visualized and interpreted imaging. I agree with  the radiologist interpretation   Medicines ordered and prescription drug management: Meds ordered this encounter  Medications   lactated ringers bolus 1,000 mL   iohexol (OMNIPAQUE) 350 MG/ML injection 75 mL   aspirin chewable  tablet 324 mg   piperacillin-tazobactam (ZOSYN) IVPB 3.375 g    Antibiotic Indication::   Sepsis    -I have reviewed the patients home medicines and have made adjustments as needed  Critical interventions  Management of sepsis   Cardiac Monitoring: The patient was maintained on a cardiac monitor.  I personally viewed and interpreted the cardiac monitored which showed an underlying rhythm of: Normal sinus rhythm   Reevaluation: After the interventions noted above, I reevaluated the patient and found that they have :improved  Co morbidities that complicate the patient evaluation  Past Medical History:  Diagnosis Date   ADD (attention deficit disorder)    evaluated by psych in the past   Chicken pox    Diabetes mellitus type II, uncontrolled    Diabetic retinopathy    Dr Debra proliferative   Erectile dysfunction    Hyperlipidemia    Hypertension    Non-compliance with treatment          Final Clinical Impression(s) / ED Diagnoses Final diagnoses:  Sepsis without acute organ dysfunction, due to unspecified organism (HCC)  Failure to thrive in adult     @PCDICTATION @    Mannie Pac T, DO 03/07/24 2141

## 2024-03-07 NOTE — H&P (Signed)
 History and Physical      Philip Holt FMW:[id number] DOB: 02/23/1951 DOA: 03/07/2024; DOS: 03/07/2024  PCP: Ransom Other, MD *** Patient coming from: home ***  I have personally briefly reviewed patient's old medical records in [facility]  Chief Complaint: ***  HPI: Philip Holt is a 73 y.o. male with medical history significant for *** who is admitted to [facility] on 03/07/2024 with *** after presenting from home*** to [facility] ED complaining of ***.    ***       ***   ED Course:  Vital signs in the ED were notable for the following: ***  Labs were notable for the following: ***  Per my interpretation, EKG in ED demonstrated the following:  ***  Imaging in the ED, per corresponding formal radiology read, was notable for the following:  ***  EDP d/w on-call cardiology fellow, who felt that presentation was most suggestive of type 2 supply demand mismatch in setting of presenting pneumonia, and that ACS was less likely. Cardiology did not feel that any additional cardiac evaluation of this mild elevation in trop was needed at this time, and conveyed that cardiology is available, as needed, if formal consult is needed.    While in the ED, the following were administered: ***  Subsequently, the patient was admitted  ***  ***red    Review of Systems: As per HPI otherwise 10 point review of systems negative.   Past Medical History:  Diagnosis Date   ADD (attention deficit disorder)    evaluated by psych in the past   Chicken pox    Diabetes mellitus type II, uncontrolled    Diabetic retinopathy    Dr Debra proliferative   Erectile dysfunction    Hyperlipidemia    Hypertension    Non-compliance with treatment     Past Surgical History:  Procedure Laterality Date   PENILE PROSTHESIS IMPLANT     WISDOM TOOTH EXTRACTION      Social History:  reports that he quit smoking about 10 years ago. His smoking use included cigarettes. He has never  been exposed to tobacco smoke. He has never used smokeless tobacco. He reports current alcohol use. He reports that he does not use drugs.   No Known Allergies  Family History  Problem Relation Age of Onset   Hyperlipidemia Mother    Asthma Mother    Other Father        Bronchiectasis   Asthma Father    Healthy Daughter        x3   Heart disease Neg Hx    Colon cancer Neg Hx     Family history reviewed and not pertinent ***   Prior to Admission medications   Medication Sig Start Date End Date Taking? Authorizing Provider  atorvastatin (LIPITOR) 40 MG tablet Take 40 mg by mouth daily. 06/15/19   [provider]  cholecalciferol (VITAMIN D3) 25 MCG (1000 UNIT) tablet Take 1,000 Units by mouth daily.    [provider]  co-enzyme Q-10 30 MG capsule Take 30 mg by mouth 3 (three) times daily.    [provider]  furosemide  (LASIX ) 20 MG tablet Take 1 tablet (20 mg total) by mouth daily. 02/26/22   Dreama Longs, MD  insulin  lispro (HUMALOG ) 100 UNIT/ML injection Inject 0.05 mLs (5 Units total) into the skin 3 (three) times daily before meals. 09/27/14   Tysinger, Alm RAMAN, PA-C  LANTUS  SOLOSTAR 100 UNIT/ML Solostar Pen  03/09/19  [provider]  LEVEMIR  FLEXPEN 100 UNIT/ML FlexPen SMARTSIG:15 Unit(s) SUB-Q Daily 01/21/22   [provider]  lisinopril  (ZESTRIL ) 10 MG tablet Take 10 mg by mouth daily. 01/19/22   [provider]  metFORMIN  (GLUCOPHAGE ) 500 MG tablet Take 500 mg by mouth 2 (two) times daily with a meal.    [provider]  mupirocin  cream (BACTROBAN ) 2 % Apply 1 Application topically 2 (two) times daily. 02/26/22   Dreama Longs, MD  naproxen  (NAPROSYN ) 500 MG tablet Take 1 tablet (500 mg total) by mouth 2 (two) times daily as needed for mild pain, moderate pain or headache (TAKE WITH MEALS.). 10/15/15   Street, [place], PA-C  Omega-3 300 MG CAPS Take by mouth.    [provider]  potassium chloride  (KLOR-CON M) 10 MEQ tablet Take 10 mEq by mouth daily. 03/02/22   [provider]  SYNJARDY XR 25-1000 MG TB24 Take 1 tablet by mouth daily. 03/31/22   [provider]     Objective    Physical Exam: Vitals:   03/07/24 1930 03/07/24 2033 03/07/24 2045 03/07/24 2130  BP: 129/64 (!) 138/59  131/82  Pulse: (!) 116 (!) 109 (!) 123 (!) 107  Resp: (!) 25 11 16 16   Temp:      TempSrc:      SpO2: 100% 100% 100% 99%    General: appears to be stated age; alert, oriented Skin: warm, dry, no rash Head:  AT/[place] Mouth:  Oral mucosa membranes appear moist, normal dentition Neck: supple; trachea midline Heart:  RRR; did not appreciate any M/R/G Lungs: CTAB, did not appreciate any wheezes, rales, or rhonchi Abdomen: + BS; soft, ND, NT Vascular: 2+ pedal pulses b/l; 2+ radial pulses b/l Extremities: no peripheral edema, no muscle wasting Neuro: strength and sensation intact in upper and lower extremities b/l ***   *** Neuro: 5/5 strength of the proximal and distal flexors and extensors of the upper and lower extremities bilaterally; sensation intact in upper and lower extremities b/l; cranial nerves II through XII grossly intact; no pronator drift; no evidence suggestive of slurred speech, dysarthria, or facial droop; Normal muscle tone. No tremors.  *** Neuro: In the setting of the patient's current mental status and associated inability to follow instructions, unable to perform full neurologic exam at this time.  As such, assessment of strength, sensation, and cranial nerves is limited at this time. Patient noted to spontaneously move all 4 extremities. No tremors.  ***    Labs on Admission: I have personally reviewed following labs and imaging studies  CBC: Recent Labs  Lab 03/07/24 1904 03/07/24 1958 03/07/24 2050  WBC 10.5  --   --   HGB 11.7* 10.5* 11.2*  HCT 33.7* 31.0* 33.0*  MCV 85.8  --   --   PLT 242  --   --    Basic Metabolic Panel: Recent Labs   Lab 03/07/24 1904 03/07/24 1958 03/07/24 2050  NA 137 137 138  K 3.9 4.0 3.7  CL 103 102 102  CO2 22  --   --   GLUCOSE 306* 309* 284*  BUN 25* 28* 23  CREATININE 1.11 0.90 0.80  CALCIUM  9.2  --   --    GFR: CrCl cannot be calculated (Unknown ideal weight.). Liver Function Tests: Recent Labs  Lab 03/07/24 1904  AST 28  ALT 20  ALKPHOS 72  BILITOT 1.1  PROT 6.1*  ALBUMIN 3.6   No results for input(s): LIPASE, AMYLASE in the  last 168 hours. No results for input(s): AMMONIA in the last 168 hours. Coagulation Profile: No results for input(s): INR, PROTIME in the last 168 hours. Cardiac Enzymes: Recent Labs  Lab 03/07/24 1904  CKTOTAL 536*   BNP (last 3 results) No results for input(s): PROBNP in the last 8760 hours. HbA1C: No results for input(s): HGBA1C in the last 72 hours. CBG: Recent Labs  Lab 03/07/24 1917  GLUCAP 288*   Lipid Profile: No results for input(s): CHOL, HDL, LDLCALC, TRIG, CHOLHDL, LDLDIRECT in the last 72 hours. Thyroid  Function Tests: No results for input(s): TSH, T4TOTAL, FREET4, T3FREE, THYROIDAB in the last 72 hours. Anemia Panel: No results for input(s): VITAMINB12, FOLATE, FERRITIN, TIBC, IRON, RETICCTPCT in the last 72 hours. Urine analysis:    Component Value Date/Time   COLORURINE YELLOW 03/07/2024 2052   APPEARANCEUR CLEAR 03/07/2024 2052   LABSPEC 1.030 03/07/2024 2052   PHURINE 5.0 03/07/2024 2052   GLUCOSEU >=500 (A) 03/07/2024 2052   HGBUR SMALL (A) 03/07/2024 2052   BILIRUBINUR NEGATIVE 03/07/2024 2052   BILIRUBINUR n 09/27/2014 1034   KETONESUR 20 (A) 03/07/2024 2052   PROTEINUR NEGATIVE 03/07/2024 2052   UROBILINOGEN negative 09/27/2014 1034   UROBILINOGEN 0.2 12/27/2013 0801   NITRITE NEGATIVE 03/07/2024 2052   LEUKOCYTESUR NEGATIVE 03/07/2024 2052    Radiological Exams on Admission: DG Chest 1 View Result Date: 03/07/2024 EXAM: 1 VIEW XRAY OF THE CHEST  03/07/2024 08:42:00 PM COMPARISON: None available. CLINICAL HISTORY: cough. Multiple falls today and failure to thrive. FINDINGS: LUNGS AND PLEURA: Mild patchy opacity of the left lung base. No pulmonary edema. No pleural effusion. No pneumothorax. HEART AND MEDIASTINUM: Normal heart size and mediastinal contours. BONES AND SOFT TISSUES: No acute osseous abnormality. IMPRESSION: 1. Mild patchy left basilar airspace opacity. This may represent atelectasis or infection in the appropriate clinical setting. Electronically signed by: Andrea Gasman MD 03/07/2024 08:49 PM EDT RP Workstation: [id number]   CT ANGIO HEAD NECK W WO CM Result Date: 03/07/2024 EXAM: CTA HEAD AND NECK WITH AND WITHOUT 03/07/2024 07:44:54 PM TECHNIQUE: CTA of the head and neck was performed with and without the administration of 75 mL of iohexol (OMNIPAQUE) 350 MG/ML injection. Multiplanar 2D and/or 3D reformatted images are provided for review. Automated exposure control, iterative reconstruction, and/or weight based adjustment of the mA/kV was utilized to reduce the radiation dose to as low as reasonably achievable. Stenosis of the internal carotid arteries measured using NASCET criteria. COMPARISON: None available CLINICAL HISTORY: Neuro deficit, acute, stroke suspected. Fall, neuro deficit, acute, stroke suspected. 75 ml omni 350. FINDINGS: CTA NECK: AORTIC ARCH AND ARCH VESSELS: No dissection or arterial injury. No significant stenosis of the brachiocephalic or subclavian arteries. CERVICAL CAROTID ARTERIES: Right carotid bifurcation: Atherosclerosis with 70% stenosis of the proximal ICA. Left carotid bifurcation: Atherosclerosis without greater than 50% stenosis. No dissection or arterial injury. CERVICAL VERTEBRAL ARTERIES: Right vertebral origin: Moderate stenosis. Left vertebral artery: No dissection, arterial injury, or significant stenosis. LUNGS AND MEDIASTINUM: Unremarkable. SOFT TISSUES: No acute abnormality. BONES: No acute  abnormality. CTA HEAD: ANTERIOR CIRCULATION: Internal carotid arteries: Severe right and moderate left cavernous ICA stenosis. Anterior cerebral arteries: No significant stenosis. Middle cerebral arteries: No significant stenosis. No aneurysm. POSTERIOR CIRCULATION: Posterior cerebral arteries: No significant stenosis. Basilar artery: No significant stenosis. Vertebral arteries: No significant stenosis. No aneurysm. OTHER: No dural venous sinus thrombosis on this non-dedicated study. IMPRESSION: 1. No large vessel occlusion. 2. Approximately 70% stenosis of the proximal ICA. 3. Severe right and moderate  left cavernous ICA stenosis. 4. Moderate right vertebral artery origin stenosis. Electronically signed by: Gilmore Molt MD 03/07/2024 08:46 PM EDT RP Workstation: [id number]      Assessment/Plan   Principal Problem:   CAP (community acquired pneumonia)   ***            ***                  ***                   ***                  ***                  ***                  ***                   ***                  ***                  ***                  ***                  ***                 ***                ***  DVT prophylaxis: SCD's ***  Code Status: Full code*** Family Communication: none*** Disposition Plan: Per Rounding Team Consults called: EDP d/w on-call cardiology fellow, who felt that presentation was most suggestive of type 2 supply demand mismatch in setting of presenting pneumonia, and that ACS was less likely. Cardiology did not feel that any additional cardiac evaluation of this mild elevation in trop was needed at this time, and conveyed that cardiology is available, as needed, if formal consult is needed.  ;  Admission status: ***     I SPENT  GREATER THAN 75 *** MINUTES IN CLINICAL CARE TIME/MEDICAL DECISION-MAKING IN COMPLETING THIS ADMISSION.      Eva NOVAK Jamirah Zelaya DO [facility]  From 7PM - 7AM   03/07/2024, 10:36 PM   ***

## 2024-03-07 NOTE — ED Notes (Signed)
 Dark green, light green, and lavender top drawn. Light green and lavender tops sent to main lab.

## 2024-03-08 ENCOUNTER — Inpatient Hospital Stay (HOSPITAL_COMMUNITY)

## 2024-03-08 DIAGNOSIS — N179 Acute kidney failure, unspecified: Secondary | ICD-10-CM | POA: Diagnosis present

## 2024-03-08 DIAGNOSIS — R7989 Other specified abnormal findings of blood chemistry: Secondary | ICD-10-CM

## 2024-03-08 DIAGNOSIS — R748 Abnormal levels of other serum enzymes: Secondary | ICD-10-CM | POA: Diagnosis present

## 2024-03-08 DIAGNOSIS — R531 Weakness: Secondary | ICD-10-CM

## 2024-03-08 DIAGNOSIS — G9341 Metabolic encephalopathy: Secondary | ICD-10-CM | POA: Diagnosis present

## 2024-03-08 DIAGNOSIS — D649 Anemia, unspecified: Secondary | ICD-10-CM | POA: Diagnosis present

## 2024-03-08 DIAGNOSIS — R9431 Abnormal electrocardiogram [ECG] [EKG]: Secondary | ICD-10-CM | POA: Diagnosis present

## 2024-03-08 DIAGNOSIS — I10 Essential (primary) hypertension: Secondary | ICD-10-CM

## 2024-03-08 DIAGNOSIS — E872 Acidosis, unspecified: Secondary | ICD-10-CM | POA: Diagnosis present

## 2024-03-08 DIAGNOSIS — Z8679 Personal history of other diseases of the circulatory system: Secondary | ICD-10-CM

## 2024-03-08 DIAGNOSIS — R739 Hyperglycemia, unspecified: Secondary | ICD-10-CM | POA: Diagnosis present

## 2024-03-08 LAB — CBG MONITORING, ED
Glucose-Capillary: 153 mg/dL — ABNORMAL HIGH (ref 70–99)
Glucose-Capillary: 262 mg/dL — ABNORMAL HIGH (ref 70–99)
Glucose-Capillary: 275 mg/dL — ABNORMAL HIGH (ref 70–99)

## 2024-03-08 LAB — COMPREHENSIVE METABOLIC PANEL WITH GFR
ALT: 20 U/L (ref 0–44)
AST: 38 U/L (ref 15–41)
Albumin: 3.2 g/dL — ABNORMAL LOW (ref 3.5–5.0)
Alkaline Phosphatase: 42 U/L (ref 38–126)
Anion gap: 14 (ref 5–15)
BUN: 18 mg/dL (ref 8–23)
CO2: 23 mmol/L (ref 22–32)
Calcium: 8.9 mg/dL (ref 8.9–10.3)
Chloride: 98 mmol/L (ref 98–111)
Creatinine, Ser: 0.85 mg/dL (ref 0.61–1.24)
GFR, Estimated: >60 mL/min (ref >60)
Glucose, Bld: 279 mg/dL — ABNORMAL HIGH (ref 70–99)
Potassium: 3.3 mmol/L — ABNORMAL LOW (ref 3.5–5.1)
Sodium: 135 mmol/L (ref 135–145)
Total Bilirubin: 0.9 mg/dL (ref 0.0–1.2)
Total Protein: 5.9 g/dL — ABNORMAL LOW (ref 6.5–8.1)

## 2024-03-08 LAB — GLUCOSE, CAPILLARY
Glucose-Capillary: 178 mg/dL — ABNORMAL HIGH (ref 70–99)
Glucose-Capillary: 161 mg/dL — ABNORMAL HIGH (ref 70–99)

## 2024-03-08 LAB — IRON AND TIBC
Iron: 17 ug/dL — ABNORMAL LOW (ref 45–182)
Saturation Ratios: 6 % — ABNORMAL LOW (ref 17.9–39.5)
TIBC: 267 ug/dL (ref 250–450)
UIBC: 250 ug/dL

## 2024-03-08 LAB — HEMOGLOBIN A1C
Hgb A1c MFr Bld: 6.5 % — ABNORMAL HIGH (ref 4.8–5.6)
Mean Plasma Glucose: 139.85 mg/dL

## 2024-03-08 LAB — CK: Total CK: 749 U/L — ABNORMAL HIGH (ref 49–397)

## 2024-03-08 LAB — CBC WITH DIFFERENTIAL/PLATELET
Abs Immature Granulocytes: 0.04 K/uL (ref 0.00–0.07)
Basophils Absolute: 0 K/uL (ref 0.0–0.1)
Basophils Relative: 1 %
Eosinophils Absolute: 0 K/uL (ref 0.0–0.5)
Eosinophils Relative: 0 %
HCT: 31.8 % — ABNORMAL LOW (ref 39.0–52.0)
Hemoglobin: 11.2 g/dL — ABNORMAL LOW (ref 13.0–17.0)
Immature Granulocytes: 1 %
Lymphocytes Relative: 14 %
Lymphs Abs: 1.1 K/uL (ref 0.7–4.0)
MCH: 30 pg (ref 26.0–34.0)
MCHC: 35.2 g/dL (ref 30.0–36.0)
MCV: 85.3 fL (ref 80.0–100.0)
Monocytes Absolute: 0.5 K/uL (ref 0.1–1.0)
Monocytes Relative: 7 %
Neutro Abs: 6 K/uL (ref 1.7–7.7)
Neutrophils Relative %: 77 %
Platelets: 214 K/uL (ref 150–400)
RBC: 3.73 MIL/uL — ABNORMAL LOW (ref 4.22–5.81)
RDW: 12.6 % (ref 11.5–15.5)
WBC: 7.7 K/uL (ref 4.0–10.5)
nRBC: 0 % (ref 0.0–0.2)

## 2024-03-08 LAB — FOLATE: Folate: >20 ng/mL (ref >5.9)

## 2024-03-08 LAB — PROCALCITONIN: Procalcitonin: <0.1 ng/mL

## 2024-03-08 LAB — ECHOCARDIOGRAM COMPLETE: S' Lateral: 3.1 cm

## 2024-03-08 LAB — MAGNESIUM
Magnesium: 1.5 mg/dL — ABNORMAL LOW (ref 1.7–2.4)
Magnesium: 1.7 mg/dL (ref 1.7–2.4)

## 2024-03-08 LAB — PHOSPHORUS: Phosphorus: 2.1 mg/dL — ABNORMAL LOW (ref 2.5–4.6)

## 2024-03-08 LAB — PROTIME-INR
INR: 1.1 (ref 0.8–1.2)
Prothrombin Time: 14.6 s (ref 11.4–15.2)

## 2024-03-08 LAB — SODIUM, URINE, RANDOM: Sodium, Ur: 92 mmol/L

## 2024-03-08 LAB — VITAMIN B12: Vitamin B-12: 923 pg/mL — ABNORMAL HIGH (ref 180–914)

## 2024-03-08 LAB — STREP PNEUMONIAE URINARY ANTIGEN: Strep Pneumo Urinary Antigen: NEGATIVE

## 2024-03-08 LAB — CREATININE, URINE, RANDOM: Creatinine, Urine: 54 mg/dL

## 2024-03-08 LAB — FERRITIN: Ferritin: 106 ng/mL (ref 24–336)

## 2024-03-08 LAB — TSH: TSH: 1.311 u[IU]/mL (ref 0.350–4.500)

## 2024-03-08 LAB — APTT: aPTT: 29 s (ref 24–36)

## 2024-03-08 MED ORDER — K PHOS MONO-SOD PHOS DI & MONO 155-852-130 MG PO TABS
500.0000 mg | ORAL_TABLET | Freq: Two times a day (BID) | ORAL | Status: AC
  Administered 2024-03-08 (×2): 500 mg via ORAL
  Filled 2024-03-08 (×4): qty 2

## 2024-03-08 MED ORDER — MORPHINE SULFATE (PF) 2 MG/ML IV SOLN: 1.0000 mg | INTRAVENOUS | Status: DC | PRN

## 2024-03-08 MED ORDER — OXYCODONE HCL 5 MG PO TABS
5.0000 mg | ORAL_TABLET | Freq: Four times a day (QID) | ORAL | Status: DC | PRN
  Administered 2024-03-08 – 2024-03-09 (×2): 5 mg via ORAL
  Filled 2024-03-08 (×2): qty 1

## 2024-03-08 MED ORDER — SODIUM CHLORIDE 0.9 % IV SOLN
100.0000 mg | Freq: Two times a day (BID) | INTRAVENOUS | Status: DC
  Administered 2024-03-08 – 2024-03-09 (×4): 100 mg via INTRAVENOUS
  Filled 2024-03-08 (×5): qty 100

## 2024-03-08 MED ORDER — LACTATED RINGERS IV SOLN: INTRAVENOUS | Status: DC

## 2024-03-08 MED ORDER — INSULIN GLARGINE 100 UNIT/ML ~~LOC~~ SOLN
15.0000 [IU] | Freq: Every day | SUBCUTANEOUS | Status: DC
  Administered 2024-03-08: 15 [IU] via SUBCUTANEOUS
  Filled 2024-03-08 (×3): qty 0.15

## 2024-03-08 MED ORDER — BENZONATATE 100 MG PO CAPS: 200.0000 mg | ORAL_CAPSULE | Freq: Three times a day (TID) | ORAL | Status: DC | PRN

## 2024-03-08 MED ORDER — ASPIRIN 81 MG PO TBEC
81.0000 mg | DELAYED_RELEASE_TABLET | Freq: Every day | ORAL | Status: DC
  Administered 2024-03-08 – 2024-03-13 (×6): 81 mg via ORAL
  Filled 2024-03-08 (×6): qty 1

## 2024-03-08 MED ORDER — MAGNESIUM SULFATE 2 GM/50ML IV SOLN
2.0000 g | Freq: Once | INTRAVENOUS | Status: AC
  Administered 2024-03-08: 2 g via INTRAVENOUS
  Filled 2024-03-08: qty 50

## 2024-03-08 NOTE — Progress Notes (Signed)
   Inpatient Rehab Admissions Coordinator :  Per therapy recommendations, patient was screened for CIR candidacy by Heron Leavell RN MSN. I await further medical workup to determine if he meets medical neccesity guidelines to pursue CIR admit. I will follow up tomorrow.  Heron Leavell, RN, MSN Rehab Admissions Coordinator [phone] 03/08/2024 12:43 PM

## 2024-03-08 NOTE — ED Notes (Signed)
 Pt returning from Echo, will be transported to floor

## 2024-03-08 NOTE — Plan of Care (Signed)
  Problem: Fluid Volume: Goal: Ability to maintain a balanced intake and output will improve Outcome: Progressing   Problem: Metabolic: Goal: Ability to maintain appropriate glucose levels will improve Outcome: Progressing   Problem: Clinical Measurements: Goal: Diagnostic test results will improve Outcome: Progressing

## 2024-03-08 NOTE — Evaluation (Signed)
 Physical Therapy Evaluation Patient Details Name: Philip Holt MRN: [id number] DOB: 1951/01/29 Today's Date: 03/08/2024  History of Present Illness  Pt is a 73 y.o. male who presented 03/07/24 with multiple falls, failure to thrive, visual hallucination, and urinary incontinence. Pt admitted with rhabdomyolysis, AKI, and acute medical encephalopathy. PMH: DM2, HTN, HLD, diabetic neuropathy, retinopathy, ADD, noncompliance to medicines   Clinical Impression  Pt presents with condition above and deficits mentioned below, see PT Problem List. It is unclear whether the info the pt provided about his PLOF/home info is accurate as pt is disoriented to situation and currently hallucinating (talking to someone when no one was in the room upon arrival, saw a watch on the floor that was not present). Pt reports at baseline he is mod I holding onto furniture or using a RW to mobilize. He reports he lives with his wife in a 1-level house with 4 STE. Currently, the pt displays an anterior and R lateral bias when sitting EOB along with deficits in cognition, balance, activity tolerance, power, and generalized strength. He is at high risk for falls, needing maxA for bed mobility, transfers, and to take a couple small, shuffling steps with bil HHA. The pt appears to have had a drastic functional decline and has several medical complications and thus could benefit from intensive inpatient rehab, > 3 hours/day, to maximize his safety and independence with all functional mobility prior to d/c home. Will continue to follow acutely.      If plan is discharge home, recommend the following: Two people to help with walking and/or transfers;Two people to help with bathing/dressing/bathroom;Assistance with cooking/housework;Direct supervision/assist for medications management;Direct supervision/assist for financial management;Assist for transportation;Help with stairs or ramp for entrance;Supervision due to cognitive status    Can travel by private vehicle        Equipment Recommendations Rolling walker (2 wheels);BSC/3in1;Wheelchair (measurements PT);Wheelchair cushion (measurements PT);Hospital bed;Hoyer lift (pending progress)  Recommendations for Other Services  Rehab consult    Functional Status Assessment Patient has had a recent decline in their functional status and demonstrates the ability to make significant improvements in function in a reasonable and predictable amount of time.     Precautions / Restrictions Precautions Precautions: Fall Recall of Precautions/Restrictions: Impaired Restrictions Weight Bearing Restrictions Per Provider Order: No      Mobility  Bed Mobility Overal bed mobility: Needs Assistance Bed Mobility: Supine to Sit, Sit to Supine     Supine to sit: Max assist, HOB elevated Sit to supine: Max assist, HOB elevated   General bed mobility comments: Pt needed frequent redirecting to remain focused on moving his legs towards and off R EOB, needing modA to complete leg movement and maxA at trunk to ascend and obtain midline placement as pt tends to lean to the R. MaxA needed to manage trunk and legs back to supine with pt grabbing onto IV pole due to pt's anxiety.    Transfers Overall transfer level: Needs assistance Equipment used: 1 person hand held assist Transfers: Sit to/from Stand Sit to Stand: Mod assist           General transfer comment: Pt holding onto therapist anterior to him for support, needing modA for balance when standing from EOB 3x.    Ambulation/Gait Ambulation/Gait assistance: Max assist Gait Distance (Feet): 1 Feet Assistive device: 1 person hand held assist Gait Pattern/deviations: Step-to pattern, Decreased step length - right, Decreased step length - left, Decreased stride length, Decreased weight shift to right, Decreased weight shift  to left, Trunk flexed, Leaning posteriorly, Shuffle Gait velocity: reduced Gait velocity  interpretation: <1.31 ft/sec, indicative of household ambulator   General Gait Details: Pt takes slow, small, unsteady, shuffling steps with bil HHA on therapist anterior to him. Despite max cues, pt maintained a flexed posture. MaxA needed for balance, weight shifting, and cuing pt to step. Pt often would reach out for furniture for support even though cued to keep hands on therapist for support.  Stairs            Wheelchair Mobility     Tilt Bed    Modified Rankin (Stroke Patients Only)       Balance Overall balance assessment: Needs assistance Sitting-balance support: Single extremity supported, Bilateral upper extremity supported, Feet supported Sitting balance-Leahy Scale: Poor Sitting balance - Comments: Pt with anterior and R lateral lean, needing modA the majority of the time for static sitting balance Postural control: Right lateral lean, Other (comment) (anterior lean) Standing balance support: Bilateral upper extremity supported, During functional activity Standing balance-Leahy Scale: Poor Standing balance comment: reliant on UE support and external physical assistance to stand                             Pertinent Vitals/Pain Pain Assessment Pain Assessment: Faces Faces Pain Scale: Hurts little more Pain Location: L shoulder Pain Descriptors / Indicators: Discomfort, Grimacing, Guarding Pain Intervention(s): Limited activity within patient's tolerance, Monitored during session, Repositioned    Home Living Family/patient expects to be discharged to:: Private residence Living Arrangements: Spouse/significant other Available Help at Discharge: Family;Available 24 hours/day (pt reports wife works) Type of Home: [employer] Access: Stairs to enter Entrance Stairs-Rails: Can reach both Entrance Stairs-Number of Steps: 4   Home Layout: One level;Laundry or work area in [employer] Equipment: Agricultural consultant (2 wheels);Rollator (4 wheels);Other  (comment) (walking stick) Additional Comments: unsure of accuracy of info provided by pt since he is currently hallucinating    Prior Function Prior Level of Function : Independent/Modified Independent;Patient poor historian/Family not available             Mobility Comments: Pt reports he is mod I holding onto furniture vs using a RW to ambulate       Extremity/Trunk Assessment   Upper Extremity Assessment Upper Extremity Assessment: Defer to OT evaluation    Lower Extremity Assessment Lower Extremity Assessment: Generalized weakness    Cervical / Trunk Assessment Cervical / Trunk Assessment: Kyphotic (leans to the R)  Communication   Communication Communication: No apparent difficulties    Cognition Arousal: Alert Behavior During Therapy: Impulsive, Restless, Anxious   PT - Cognitive impairments: Orientation, Awareness, Memory, Attention, Sequencing, Initiation, Problem solving, Safety/Judgement   Orientation impairments: Situation (did not ask time)                   PT - Cognition Comments: Pt oriented to self and location but not to situation, states he is here because he likes sweets too much. Pt restless and impulsive to move lines and stand or reach for objects when cued to keep hands stationary. Pt with poor awareness of his anterior and R lateral lean and unaware of his impaired safety and need to return to supine. Pt needs redirecting often Following commands: Impaired Following commands impaired: Follows one step commands inconsistently, Follows one step commands with increased time     Cueing Cueing Techniques: Verbal cues, Tactile cues, Gestural cues  General Comments      Exercises     Assessment/Plan    PT Assessment Patient needs continued PT services  PT Problem List Decreased strength;Decreased activity tolerance;Decreased balance;Decreased coordination;Decreased mobility;Decreased cognition;Decreased knowledge of use of  DME;Decreased safety awareness;Decreased knowledge of precautions       PT Treatment Interventions DME instruction;Gait training;Stair training;Functional mobility training;Therapeutic activities;Therapeutic exercise;Neuromuscular re-education;Balance training;Cognitive remediation;Patient/family education;Wheelchair mobility training    PT Goals (Current goals can be found in the Care Plan section)  Acute Rehab PT Goals Patient Stated Goal: to not fall PT Goal Formulation: With patient Time For Goal Achievement: 03/22/24 Potential to Achieve Goals: Good    Frequency Min 3X/week     Co-evaluation               AM-PAC PT 6 Clicks Mobility  Outcome Measure Help needed turning from your back to your side while in a flat bed without using bedrails?: A Lot Help needed moving from lying on your back to sitting on the side of a flat bed without using bedrails?: A Lot Help needed moving to and from a bed to a chair (including a wheelchair)?: A Lot Help needed standing up from a chair using your arms (e.g., wheelchair or bedside chair)?: A Lot Help needed to walk in hospital room?: Total Help needed climbing 3-5 steps with a railing? : Total 6 Click Score: 10    End of Session   Activity Tolerance: Patient tolerated treatment well Patient left: in bed;with call bell/phone within reach;with nursing/sitter in room Nurse Communication: Mobility status;Other (comment) (pt still hallucinating) PT Visit Diagnosis: Unsteadiness on feet (R26.81);Other abnormalities of gait and mobility (R26.89);Muscle weakness (generalized) (M62.81);History of falling (Z91.81);Repeated falls (R29.6);Difficulty in walking, not elsewhere classified (R26.2);Adult, failure to thrive (R62.7)    Time: 1138-1205 PT Time Calculation (min) (ACUTE ONLY): 27 min   Charges:   PT Evaluation $PT Eval Moderate Complexity: 1 Mod PT Treatments $Therapeutic Activity: 8-22 mins PT General Charges $$ ACUTE PT VISIT:  1 Visit         Theo Ferretti, PT, DPT Acute Rehabilitation Services  Office: [phone]   Theo CHRISTELLA Ferretti 03/08/2024, 12:26 PM

## 2024-03-08 NOTE — Progress Notes (Signed)
 PROGRESS NOTE  [name]  DOB: [birth year]  PCP: Ransom Other, MD FMW:[id number]  DOA: 03/07/2024  LOS: 1 day  Hospital Day: 2  Subjective: Patient was seen and examined this morning. Pleasant elderly Caucasian male.  Propped up in bed.  Not in distress.  Alert, awake, oriented to place and person.  Able to answer questions.  No family at bedside. Chart reviewed. Remains afebrile, tachycardic to 100s, blood pressure stable, breathing on room air Most recent labs from this morning with potassium low at 3.3, magnesium 1.7, WBC count 7.7, hemoglobin 11.2  Brief narrative: Philip Holt is a 73 y.o. male with PMH significant for DM2, HTN, HLD, diabetic neuropathy, retinopathy, ADD, noncompliance to medicines 10/7, patient was brought to the ED from home by EMS, multiple falls, failure to thrive, visual hallucinations, urinary incontinence. Family reported over the last 1 week, patient's mental status has been getting worse.  He also has generalized weakness, poor oral intake, hydration.  In the ED, patient was afebrile, tachycardic to 110s, blood pressure stable, breathing on room air Initial labs with WC count 10.5, hemoglobin 11.7, glucose 326, magnesium low at 1.5, renal function normal, CK and troponin elevated,  Respiratory virus panel unremarkable  procalcitonin level not elevated, lactic acid not elevated Urinalysis showed clear yellow urine with small hemoglobin, 20 ketones, negative nitrate and negative leukocytes. Chest x-ray showed mild patchy left basilar airspace opacity -atelectasis versus infiltrate  CT angio head and neck did not show any large vessel occlusion, showed approximately 70% stenosis of the proximal ICA, severe right and moderate left cavernous ICA stenosis and moderate right vertebral artery origin stenosis. EKG showed excellent results and rhythm at 190 bpm, QTc 528 ms Blood culture sent  Patient was given IV fluid, IV Zosyn EDP discussed with  cardiology on-call for elevated troponin.  It was suspected to be due to rhabdomyolysis and not a primary cardiac event. Admitted to [facility]  Assessment and plan: Rhabdomyolysis Likely due to generalized weakness and immobility Expect improvement with IV fluid. I believe troponin is of muscle origin and not ACS.  Can check echo to rule out WMA Recent Labs    03/07/24 1904 03/07/24 2104 03/08/24 0559  CKTOTAL 536*  --  749*  TROPONINIHS 168* 158*  --    Lower lower lobe opacity Lactic acidosis Chest x-ray showed mild patchy left basilar opacity-atelectasis versus infiltrate Given the absence of fever normal WBC count, negative RVP and low procalcitonin level, I do not suspect this is pneumonia.  Likely atelectasis.  Mild elevation in lactic acid level likely due to dehydration, AKI Was started on empiric antibiotics on admission.  I would hold it at this time.  Her stop Await blood culture report. Recent Labs  Lab 03/07/24 1904 03/07/24 2050 03/07/24 2258 03/08/24 0559  WBC 10.5  --   --  7.7  LATICACIDVEN  --  2.3* 1.2  --   PROCALCITON <0.10  --   --   --    AKI Creatinine not elevated BUN/creatinine ratio was elevated due to dehydration Gradually improving with IV hydration.  Continue to monitor  Recent Labs    07/22/23 2304 03/07/24 1904 03/07/24 1958 03/07/24 2050 03/08/24 0559  BUN 12 25* 28* 23 18  CREATININE 0.66 1.11 0.90 0.80 0.85  CO2 21* 22  --   --  23   Prolonged Qtc Initial EKG with QTc prolonged to 528 ms.  Likely due to low electrolytes Supplement electrolytes and repeat EKG  Hypokalemia/hypomagnesemia/hypophosphatemia  Low electrolytes because of poor intake and hydration. Replacement ordered Recent Labs  Lab 03/07/24 1904 03/07/24 1958 03/07/24 2050 03/08/24 0559  K 3.9 4.0 3.7 3.3*  MG 1.5*  --   --  1.7  PHOS  --   --   --  2.1*   Acute medical encephalopathy Altered mental status at presentation likely due to dehydration.  CT head in  the past had also shown atrophy and small vessel ischemic changes.  So patient has underlying risk factors for vascular dementia as well.   Mental status seems to be improving.  Alert, awake, oriented to place and person today.  Not restless or agitated. Continue to monitor mental status change  Type 2 diabetes mellitus with hyperglycemia A1c seems controlled at 6.5 on 03/07/2024 But glucose level was elevated to over 200 at presentation, probably has not been taking insulin /meds recently. PTA meds-Lantus  30 units daily, metformin  1000 mg daily, empagliflozin 25 mg daily, Ozempic weekly, Currently on Lantus  5 units daily and SSI/Accu-Cheks.  Blood sugar level running elevated.  I will increase Lantus  to 15 units for tonight. Recent Labs  Lab 03/07/24 1917 03/08/24 0050  GLUCAP 288* 262*   Hypertension Sinus tachycardia Blood pressure in normal range but heart rate has been running elevated consistently.  Partly due to dehydration.   PTA meds-  lisinopril  10 mg daily, Lasix  20 mg daily PRN Hold Lasix  and lisinopril  because of AKI. Continue monitor heart rate and blood pressure.  May benefit from Coreg if tachycardia persists IV hydralazine as needed.  Hyperlipidemia Continue aspirin 81 mg daily.  Lipitor to be held because of rhabdomyolysis  Mild anemia No active bleeding.  No clear iron, folic acid or vitamin B12 deficiency noted. Continue to monitor Recent Labs    07/22/23 2304 03/07/24 1904 03/07/24 1958 03/07/24 2050 03/08/24 0559  HGB 13.6 11.7* 10.5* 11.2* 11.2*  MCV 84.3 85.8  --   --  85.3  FOLATE  --  >20.0  --   --   --   FERRITIN  --  106  --   --   --   TIBC  --  267  --   --   --   IRON  --  17*  --   --   --    Generalized weakness Failure to thrive PT eval.  Nutrition eval. May need placement    Mobility:  PT Orders: Active   PT Follow up Rec:    Goals of care   Code Status: Full Code     DVT prophylaxis:  SCDs Start: 03/07/24 2329    Antimicrobials: None Fluid: Currently on LR at 100 mL/h to continue Consultants: None Family Communication: None  Status: Inpatient Level of care:  Telemetry Medical   Patient is from: Home Needs to continue in-hospital care: Continue IV fluid.  Repeat CK tomorrow Anticipated d/c to: Pending clinical course, pending PT eval    Diet:  Diet Order             Diet regular Room service appropriate? Yes; Fluid consistency: Thin  Diet effective now                   Scheduled Meds:  aspirin EC  81 mg Oral Daily   insulin  aspart  0-5 Units Subcutaneous QHS   insulin  aspart  0-9 Units Subcutaneous TID WC   insulin  glargine  15 Units Subcutaneous Q2200   phosphorus  500 mg Oral BID    PRN  meds: acetaminophen  **OR** acetaminophen , benzonatate, melatonin, ondansetron (ZOFRAN) IV   Infusions:   cefTRIAXone (ROCEPHIN)  IV Stopped (03/08/24 0034)   doxycycline (VIBRAMYCIN) IV 100 mg (03/08/24 0945)   lactated ringers      Antimicrobials: Anti-infectives (From admission, onward)    Start     Dose/Rate Route Frequency Ordered Stop   03/08/24 0045  doxycycline (VIBRAMYCIN) 100 mg in sodium chloride  0.9 % 250 mL IVPB        100 mg 125 mL/hr over 120 Minutes Intravenous Every 12 hours 03/08/24 0021     03/07/24 2359  cefTRIAXone (ROCEPHIN) 1 g in sodium chloride  0.9 % 100 mL IVPB        1 g 200 mL/hr over 30 Minutes Intravenous Daily at bedtime 03/07/24 2330     03/07/24 2359  azithromycin (ZITHROMAX) 500 mg in sodium chloride  0.9 % 250 mL IVPB  Status:  Discontinued        500 mg 250 mL/hr over 60 Minutes Intravenous Daily at bedtime 03/07/24 2330 03/08/24 0021   03/07/24 2100  piperacillin-tazobactam (ZOSYN) IVPB 3.375 g        3.375 g 100 mL/hr over 30 Minutes Intravenous  Once 03/07/24 2057 03/07/24 2135       Objective: Vitals:   03/08/24 0737 03/08/24 1048  BP: 132/78 (!) 155/77  Pulse: (!) 104 99  Resp: 16 18  Temp: 98.2 F (36.8 C)   SpO2: 98% 100%     Intake/Output Summary (Last 24 hours) at 03/08/2024 1121 Last data filed at 03/08/2024 1056 Gross per 24 hour  Intake 1094.62 ml  Output --  Net 1094.62 ml   There were no vitals filed for this visit. Weight change:  There is no height or weight on file to calculate BMI.   Physical Exam: General exam: Pleasant, elderly Caucasian male.  Not in distress Skin: No rashes, lesions or ulcers. HEENT: Atraumatic, normocephalic, no obvious bleeding Lungs: Clear to auscultation bilaterally,  CVS: S1, S2, no murmur,   GI/Abd: Soft, nontender, nondistended, bowel sound present,   CNS: Alert, awake, oriented to place and person Psychiatry: Mood appropriate Extremities: Chronic stasis changes but no edema on both legs.  Warm, no calf tenderness  Data Review: I have personally reviewed the laboratory data and studies available.  F/u labs  Unresulted Labs (From admission, onward)     Start     Ordered   03/09/24 0500  CBC with Differential/Platelet  Tomorrow morning,   Philip        03/08/24 0806   03/09/24 0500  Basic metabolic panel with GFR  Tomorrow morning,   Philip        03/08/24 0806   03/09/24 0500  CK  Tomorrow morning,   Philip        03/08/24 1119   03/08/24 1255  Vitamin B12  Once,   Philip        03/08/24 1255            Signed, Chapman Rota, MD [facility] 03/08/2024

## 2024-03-08 NOTE — Progress Notes (Signed)
 Echocardiogram 2D Echocardiogram has been performed.  Philip Holt RDCS 03/08/2024, 2:45 PM

## 2024-03-08 NOTE — ED Notes (Signed)
CBG 275 

## 2024-03-09 DIAGNOSIS — E43 Unspecified severe protein-calorie malnutrition: Secondary | ICD-10-CM | POA: Insufficient documentation

## 2024-03-09 DIAGNOSIS — R748 Abnormal levels of other serum enzymes: Secondary | ICD-10-CM | POA: Diagnosis not present

## 2024-03-09 LAB — CBC WITH DIFFERENTIAL/PLATELET
Abs Immature Granulocytes: 0.04 K/uL (ref 0.00–0.07)
Basophils Absolute: 0.1 K/uL (ref 0.0–0.1)
Basophils Relative: 1 %
Eosinophils Absolute: 0.1 K/uL (ref 0.0–0.5)
Eosinophils Relative: 2 %
HCT: 30.8 % — ABNORMAL LOW (ref 39.0–52.0)
Hemoglobin: 10.9 g/dL — ABNORMAL LOW (ref 13.0–17.0)
Immature Granulocytes: 1 %
Lymphocytes Relative: 22 %
Lymphs Abs: 1.4 K/uL (ref 0.7–4.0)
MCH: 29.8 pg (ref 26.0–34.0)
MCHC: 35.4 g/dL (ref 30.0–36.0)
MCV: 84.2 fL (ref 80.0–100.0)
Monocytes Absolute: 0.5 K/uL (ref 0.1–1.0)
Monocytes Relative: 7 %
Neutro Abs: 4.5 K/uL (ref 1.7–7.7)
Neutrophils Relative %: 67 %
Platelets: 208 K/uL (ref 150–400)
RBC: 3.66 MIL/uL — ABNORMAL LOW (ref 4.22–5.81)
RDW: 12.5 % (ref 11.5–15.5)
WBC: 6.6 K/uL (ref 4.0–10.5)
nRBC: 0 % (ref 0.0–0.2)

## 2024-03-09 LAB — MAGNESIUM: Magnesium: 1.7 mg/dL (ref 1.7–2.4)

## 2024-03-09 LAB — GLUCOSE, CAPILLARY
Glucose-Capillary: 71 mg/dL (ref 70–99)
Glucose-Capillary: 193 mg/dL — ABNORMAL HIGH (ref 70–99)
Glucose-Capillary: 262 mg/dL — ABNORMAL HIGH (ref 70–99)
Glucose-Capillary: 181 mg/dL — ABNORMAL HIGH (ref 70–99)

## 2024-03-09 LAB — TROPONIN I (HIGH SENSITIVITY): Troponin I (High Sensitivity): 155 ng/L (ref <18)

## 2024-03-09 LAB — CK: Total CK: 611 U/L — ABNORMAL HIGH (ref 49–397)

## 2024-03-09 LAB — BASIC METABOLIC PANEL WITH GFR
Anion gap: 13 (ref 5–15)
BUN: 10 mg/dL (ref 8–23)
CO2: 24 mmol/L (ref 22–32)
Calcium: 8.8 mg/dL — ABNORMAL LOW (ref 8.9–10.3)
Chloride: 102 mmol/L (ref 98–111)
Creatinine, Ser: 0.58 mg/dL — ABNORMAL LOW (ref 0.61–1.24)
GFR, Estimated: >60 mL/min (ref >60)
Glucose, Bld: 118 mg/dL — ABNORMAL HIGH (ref 70–99)
Potassium: 3 mmol/L — ABNORMAL LOW (ref 3.5–5.1)
Sodium: 139 mmol/L (ref 135–145)

## 2024-03-09 LAB — PHOSPHORUS: Phosphorus: 2.5 mg/dL (ref 2.5–4.6)

## 2024-03-09 MED ORDER — BISMUTH SUBSALICYLATE 262 MG/15ML PO SUSP
30.0000 mL | ORAL | Status: DC | PRN
  Administered 2024-03-09 – 2024-03-12 (×4): 30 mL via ORAL
  Filled 2024-03-09: qty 236

## 2024-03-09 MED ORDER — THIAMINE MONONITRATE 100 MG PO TABS
100.0000 mg | ORAL_TABLET | Freq: Every day | ORAL | Status: DC
  Administered 2024-03-09 – 2024-03-10 (×2): 100 mg
  Filled 2024-03-09 (×2): qty 1

## 2024-03-09 MED ORDER — METFORMIN HCL 500 MG PO TABS
1000.0000 mg | ORAL_TABLET | Freq: Every day | ORAL | Status: DC
  Administered 2024-03-09 – 2024-03-13 (×5): 1000 mg via ORAL
  Filled 2024-03-09 (×5): qty 2

## 2024-03-09 MED ORDER — MELATONIN 5 MG PO TABS
5.0000 mg | ORAL_TABLET | Freq: Every day | ORAL | Status: DC
  Administered 2024-03-09 – 2024-03-12 (×4): 5 mg via ORAL
  Filled 2024-03-09 (×4): qty 1

## 2024-03-09 MED ORDER — POTASSIUM CHLORIDE CRYS ER 20 MEQ PO TBCR
40.0000 meq | EXTENDED_RELEASE_TABLET | Freq: Once | ORAL | Status: AC
  Administered 2024-03-09: 40 meq via ORAL
  Filled 2024-03-09: qty 2

## 2024-03-09 MED ORDER — INSULIN GLARGINE 100 UNIT/ML ~~LOC~~ SOLN
5.0000 [IU] | Freq: Every day | SUBCUTANEOUS | Status: DC
  Administered 2024-03-09: 5 [IU] via SUBCUTANEOUS
  Filled 2024-03-09 (×2): qty 0.05

## 2024-03-09 MED ORDER — LACTATED RINGERS IV SOLN: INTRAVENOUS | Status: AC

## 2024-03-09 MED ORDER — ENSURE PLUS HIGH PROTEIN PO LIQD
237.0000 mL | Freq: Two times a day (BID) | ORAL | Status: DC
  Administered 2024-03-11 – 2024-03-13 (×6): 237 mL via ORAL

## 2024-03-09 NOTE — Progress Notes (Signed)
   Inpatient Rehabilitation Admissions Coordinator   Per OT recommendations are for SNF. We will not pursue Cir. TOC is aware.  Heron Leavell, RN, MSN Rehab Admissions Coordinator [phone] 03/09/2024 11:47 AM

## 2024-03-09 NOTE — TOC Initial Note (Signed)
 Transition of Care [facility]) - Initial/Assessment Note    Patient Details  Name: Philip Holt MRN: [id number] Date of Birth: [birth year]  Transition of Care [facility]) CM/SW Contact:    Rosalva Jon Bloch, RN Phone Number: 03/09/2024, 3:08 PM  Clinical Narrative:                  Admitted with rhabdomyolysis, AKI, and acute medical encephalopathy. Hx of multi falls. Recently divorced. From home alone. 3 supportive daughters,daughter Pleasant lives in [place]. PTA pt would require some assistance with bathing and dressing. Daughter, [name] [phone]) lives in [place]. NCM spoke with patient and daughter Therisa @ bedside regarding therapy recommendation of SNF placement at time of discharge. Patient agreeable given  current physical needs and fall risk. Patient without preference for SNF. NCM discussed insurance authorization process and provided Medicare SNF ratings list. No further questions reported at this time. NCM to continue to follow and assist with discharge planning needs.   Expected Discharge Plan: Skilled Nursing Facility Barriers to Discharge: Continued Medical Work up   Patient Goals and CMS Choice     Choice offered to / list presented to : Patient, Adult Children      Expected Discharge Plan and Services   Discharge Planning Services: CM Consult   Living arrangements for the past 2 months: Single Family Home                                      Prior Living Arrangements/Services Living arrangements for the past 2 months: Single Family Home Lives with:: Self Patient language and need for interpreter reviewed:: Yes Do you feel safe going back to the place where you live?: Yes      Need for Family Participation in Patient Care: Yes (Comment) Care giver support system in place?: No (comment)   Criminal Activity/Legal Involvement Pertinent to Current Situation/Hospitalization: No - Comment as needed  Activities of Daily Living   ADL Screening (condition at time  of admission) Independently performs ADLs?: No Does the patient have a NEW difficulty with bathing/dressing/toileting/self-feeding that is expected to last >3 days?: Yes (Initiates electronic notice to provider for possible OT consult) Does the patient have a NEW difficulty with getting in/out of bed, walking, or climbing stairs that is expected to last >3 days?: Yes (Initiates electronic notice to provider for possible PT consult) Does the patient have a NEW difficulty with communication that is expected to last >3 days?: Yes (Initiates electronic notice to provider for possible SLP consult) Is the patient deaf or have difficulty hearing?: No Does the patient have difficulty seeing, even when wearing glasses/contacts?: No Does the patient have difficulty concentrating, remembering, or making decisions?: Yes  Permission Sought/Granted                  Emotional Assessment       Orientation: : Oriented to Self, Oriented to Place, Oriented to  Time, Oriented to Situation   Psych Involvement: No (comment)  Admission diagnosis:  CAP (community acquired pneumonia) [J18.9] Failure to thrive in adult [R62.7] Sepsis without acute organ dysfunction, due to unspecified organism [facility]) [A41.9] Patient Active Problem List   Diagnosis Date Noted   Protein-calorie malnutrition, severe 03/09/2024   Lactic acidosis 03/08/2024   AKI (acute kidney injury) 03/08/2024   Elevated troponin 03/08/2024   Acute metabolic encephalopathy 03/08/2024   Acute anemia 03/08/2024   Generalized weakness 03/08/2024  Hyperglycemia 03/08/2024   Elevated CPK 03/08/2024   Prolonged QT interval 03/08/2024   History of essential hypertension 03/08/2024   Erectile dysfunction 02/20/2014   Diabetes mellitus with ophthalmic complication (HCC) 12/27/2013   Visit for preventive health examination 06/11/2013   GERD (gastroesophageal reflux disease) 06/11/2013   Adjustment reaction 09/27/2009   Background diabetic  retinopathy (HCC) 06/05/2009   DM2 (diabetes mellitus, type 2) (HCC) 01/12/2008   ONYCHOMYCOSIS, TOENAILS 01/11/2008   HLD (hyperlipidemia) 11/08/2007   ERECTILE DYSFUNCTION 11/08/2007   Essential hypertension 11/08/2007   PCP:  Ransom Other, MD Pharmacy:   [id number] [place], [place] - [address] [place] [address] Phone: [phone] Fax: [phone]  [facility] [place] [place] - [address] [place] [address] Phone: [phone] Fax: [phone]  [id number] - [place], [place] - [address] [place] [address] Phone: [phone] Fax: [phone]     Social Drivers of Health (SDOH) Social History: SDOH Screenings   Food Insecurity: Patient Unable To Answer (03/08/2024)  Housing: Unknown (03/08/2024)  Transportation Needs: Patient Unable To Answer (03/08/2024)  Utilities: Patient Unable To Answer (03/08/2024)  Financial Resource Strain: Medium Risk (11/11/2023)   Received from [facility]  Physical Activity: Unknown (11/11/2023)   Received from [facility]  Social Connections: Patient Unable To Answer (03/08/2024)  Stress: Patient Declined (11/11/2023)   Received from [facility]  Tobacco Use: Medium Risk (03/07/2024)   SDOH Interventions:     Readmission Risk Interventions     No data to display

## 2024-03-09 NOTE — Discharge Instructions (Signed)

## 2024-03-09 NOTE — Evaluation (Signed)
 Occupational Therapy Evaluation Patient Details Name: Philip Holt MRN: [id number] DOB: March 25, 1951 Today's Date: 03/09/2024   History of Present Illness   Pt is a 73 y.o. male who presented 03/07/24 with multiple falls, failure to thrive, visual hallucination, and urinary incontinence. Pt admitted with rhabdomyolysis, AKI, and acute medical encephalopathy. PMH: DM2, HTN, HLD, diabetic neuropathy, retinopathy, ADD, noncompliance to medicines     Clinical Impressions Pt seen with daughter present. Family has had many concerns about pt's ability to manage at home. He has had many falls, does not eat well and has difficulty managing medical appointments and medications. Pt no longer drives. He uses a rollator or holds furniture to walk. He reports being able to complete basic ADLs independently, although he has very long toe nails and admits he cannot reach his feet. Pt presents with impaired cognition, generalized weakness and poor standing balance. He needs min to total assist for ADLs, mod to max assist for bed mobility and min assist to stand and transfer with RW. Family wants to pursue ALF after rehab. They are unable provide 24 hour care. Patient will benefit from continued inpatient follow up therapy, <3 hours/day.     If plan is discharge home, recommend the following:   A little help with walking and/or transfers;A lot of help with bathing/dressing/bathroom;Assistance with cooking/housework;Assist for transportation;Direct supervision/assist for medications management;Direct supervision/assist for financial management;Help with stairs or ramp for entrance     Functional Status Assessment   Patient has had a recent decline in their functional status and/or demonstrates limited ability to make significant improvements in function in a reasonable and predictable amount of time     Equipment Recommendations   Other (comment) (defer)     Recommendations for Other Services          Precautions/Restrictions   Precautions Precautions: Fall Recall of Precautions/Restrictions: Impaired Restrictions Weight Bearing Restrictions Per Provider Order: No     Mobility Bed Mobility Overal bed mobility: Needs Assistance Bed Mobility: Supine to Sit, Sit to Supine     Supine to sit: Mod assist, HOB elevated, Used rails Sit to supine: Max assist, HOB elevated   General bed mobility comments: fearful of falling, assist for LEs over EOB and hips to EOB, pt pulled trunk up on therapist's hand, assist for all aspects to return to supine    Transfers Overall transfer level: Needs assistance Equipment used: Rolling walker (2 wheels) Transfers: Sit to/from Stand Sit to Stand: Min assist, From elevated surface           General transfer comment: pulls up on walker, assist to rise and steady      Balance Overall balance assessment: Needs assistance Sitting-balance support: Single extremity supported, Feet supported Sitting balance-Leahy Scale: Poor Sitting balance - Comments: close guard for static sitting   Standing balance support: Bilateral upper extremity supported Standing balance-Leahy Scale: Poor Standing balance comment: B UE support of walker and min assist                           ADL either performed or assessed with clinical judgement   ADL Overall ADL's : Needs assistance/impaired Eating/Feeding: Set up;Sitting;Bed level   Grooming: Supervision/safety;Sitting   Upper Body Bathing: Sitting;Moderate assistance   Lower Body Bathing: Total assistance;Sit to/from stand   Upper Body Dressing : Moderate assistance;Sitting   Lower Body Dressing: Total assistance;Sit to/from stand   Toilet Transfer: Minimal assistance;Stand-pivot;BSC/3in1;Rolling walker (2 wheels)   Toileting- Clothing  Manipulation and Hygiene: Total assistance;Sit to/from stand               Vision Ability to See in Adequate Light: 0 Adequate Patient Visual  Report: No change from baseline       Perception         Praxis         Pertinent Vitals/Pain Pain Assessment Pain Assessment: Faces Pain Score: 4  Faces Pain Scale: Hurts little more Pain Location: L shoulder Pain Descriptors / Indicators: Discomfort, Grimacing, Guarding Pain Intervention(s): Repositioned     Extremity/Trunk Assessment Upper Extremity Assessment Upper Extremity Assessment: Generalized weakness;Right hand dominant;LUE deficits/detail LUE Deficits / Details: shoulder pain, active FF to 45 degrees LUE Coordination: decreased gross motor (due to limited ROM)   Lower Extremity Assessment Lower Extremity Assessment: Defer to PT evaluation   Cervical / Trunk Assessment Cervical / Trunk Assessment: Kyphotic;Other exceptions (weakness)   Communication Communication Communication: No apparent difficulties   Cognition Arousal: Alert Behavior During Therapy: WFL for tasks assessed/performed Cognition: Cognition impaired   Orientation impairments: Time Awareness: Intellectual awareness impaired, Online awareness impaired Memory impairment (select all impairments): Short-term memory, Declarative long-term memory, Working Biochemist, clinical functioning impairment (select all impairments): Initiation, Sequencing, Reasoning, Problem solving                   Following commands: Impaired Following commands impaired: Follows one step commands inconsistently, Follows one step commands with increased time     Cueing  General Comments   Cueing Techniques: Verbal cues;Tactile cues;Gestural cues      Exercises     Shoulder Instructions      Home Living Family/patient expects to be discharged to:: Private residence Living Arrangements: Alone Available Help at Discharge: Family;Available PRN/intermittently (pt is recently divorced) Type of Home: House Home Access: Stairs to enter [employer] of Steps: 4 Entrance Stairs-Rails: Can reach  both Home Layout: One level;Laundry or work area in basement     [employer] Shower/Tub: Producer, television/film/video: Standard     Home Equipment: Agricultural consultant (2 wheels);Rollator (4 wheels);Other (comment);Hospital bed          Prior Functioning/Environment Prior Level of Function : Needs assist             Mobility Comments: pt either used a rollator or furniture walked, many falls ADLs Comments: former sister in law brought in  meals intermittently, pt was struggling to manage meds, daughters have forbid him from driving anymore, unable to reach feet for cutting toenails    OT Problem List: Decreased strength;Impaired balance (sitting and/or standing);Decreased cognition;Decreased safety awareness;Decreased knowledge of use of DME or AE;Pain   OT Treatment/Interventions: Self-care/ADL training;DME and/or AE instruction;Therapeutic activities;Patient/family education;Balance training      OT Goals(Current goals can be found in the care plan section)   Acute Rehab OT Goals OT Goal Formulation: With patient/family Time For Goal Achievement: 03/23/24 Potential to Achieve Goals: Fair ADL Goals Pt Will Perform Grooming: with min assist;standing Pt Will Perform Upper Body Dressing: sitting;with supervision Pt Will Transfer to Toilet: with contact guard assist;ambulating;bedside commode Pt Will Perform Toileting - Clothing Manipulation and hygiene: with min assist;sit to/from stand Additional ADL Goal #1: Pt will complete bed mobility with min assist in preparation for ADLs.   OT Frequency:  Min 2X/week    Co-evaluation              AM-PAC OT 6 Clicks Daily Activity     Outcome Measure  Help from another person eating meals?: A Little Help from another person taking care of personal grooming?: A Little Help from another person toileting, which includes using toliet, bedpan, or urinal?: Total Help from another person bathing (including washing, rinsing,  drying)?: A Lot Help from another person to put on and taking off regular upper body clothing?: A Lot Help from another person to put on and taking off regular lower body clothing?: Total 6 Click Score: 12   End of Session    Activity Tolerance: Patient tolerated treatment well Patient left: in bed;with call bell/phone within reach;with bed alarm set;with family/visitor present  OT Visit Diagnosis: Unsteadiness on feet (R26.81);Other abnormalities of gait and mobility (R26.89);Muscle weakness (generalized) (M62.81);Other symptoms and signs involving cognitive function                Time: 8946-8860 OT Time Calculation (min): 46 min Charges:  OT General Charges $OT Visit: 1 Visit OT Evaluation $OT Eval Moderate Complexity: 1 Mod OT Treatments $Self Care/Home Management : 23-37 mins  Mliss HERO, OTR/L Acute Rehabilitation Services Office: [phone]  Kennth Mliss Helling 03/09/2024, 12:04 PM

## 2024-03-09 NOTE — Progress Notes (Signed)
 PROGRESS NOTE  Philip Holt  DOB: 1951/03/22  PCP: Ransom Other, MD FMW:[id number]  DOA: 03/07/2024  LOS: 2 days  Hospital Day: 3  Subjective: Patient was seen and examined this morning. Lying down in bed.  Not in distress.  Daughter at bedside. Mental status improving.  Last BM this morning. Afebrile, hemodynamically stable, breathing on room air. Labs from this morning with potassium low at 3, CK6 111, troponin 155, hemoglobin 10.9 EKG with improvement in QTc to 415 ms  Brief narrative: Philip Holt is a 73 y.o. male with PMH significant for DM2, HTN, HLD, diabetic neuropathy, retinopathy, ADD, noncompliance to medicines 10/7, patient was brought to the ED from home by EMS, multiple falls, failure to thrive, visual hallucinations, urinary incontinence. Family reported over the last 1 week, patient's mental status has been getting worse.  He also has generalized weakness, poor oral intake, hydration.  In the ED, patient was afebrile, tachycardic to 110s, blood pressure stable, breathing on room air Initial labs with WC count 10.5, hemoglobin 11.7, glucose 326, magnesium low at 1.5, renal function normal, CK and troponin elevated,  Respiratory virus panel unremarkable  procalcitonin level not elevated, lactic acid not elevated Urinalysis showed clear yellow urine with small hemoglobin, 20 ketones, negative nitrate and negative leukocytes. Chest x-ray showed mild patchy left basilar airspace opacity -atelectasis versus infiltrate  CT angio head and neck did not show any large vessel occlusion, showed approximately 70% stenosis of the proximal ICA, severe right and moderate left cavernous ICA stenosis and moderate right vertebral artery origin stenosis. EKG showed excellent results and rhythm at 190 bpm, QTc 528 ms Blood culture sent  Patient was given IV fluid, IV Zosyn EDP discussed with cardiology on-call for elevated troponin.  It was suspected to be due to rhabdomyolysis and  not a primary cardiac event. Admitted to [facility]  Assessment and plan: Rhabdomyolysis Likely due to generalized weakness and immobility CK and troponin level gradually improving.  Continue IV hydration Echocardiogram 10/8 with EF 55 to 60% and without wall motion abnormality Recent Labs    03/07/24 1904 03/07/24 2104 03/08/24 0559 03/09/24 0255 03/09/24 0535  CKTOTAL 536*  --  749* 611*  --   TROPONINIHS 168* 158*  --   --  155*   Lower lower lobe opacity Lactic acidosis Chest x-ray on admission showed mild patchy left basilar opacity-atelectasis versus infiltrate Given the absence of fever normal WBC count, negative RVP and low procalcitonin level, I do not suspect this is pneumonia.  Likely atelectasis.  Mild elevation in lactic acid level likely due to dehydration, AKI Was started on empiric antibiotics on admission.   No growth on blood culture report.   I would stop empiric antibiotics today Recent Labs  Lab 03/07/24 1904 03/07/24 2050 03/07/24 2258 03/08/24 0559 03/09/24 0255  WBC 10.5  --   --  7.7 6.6  LATICACIDVEN  --  2.3* 1.2  --   --   PROCALCITON <0.10  --   --   --   --    AKI Creatinine not elevated BUN/creatinine ratio was elevated due to dehydration Gradually improving with IV hydration.  Continue to monitor  Recent Labs    07/22/23 2304 03/07/24 1904 03/07/24 1958 03/07/24 2050 03/08/24 0559 03/09/24 0255  BUN 12 25* 28* 23 18 10   CREATININE 0.66 1.11 0.90 0.80 0.85 0.58*  CO2 21* 22  --   --  23 24   Prolonged Qtc Initial EKG with QTc prolonged to  528 ms.  Likely due to low electrolytes Electrolytes replaced. Repeat EKG today showed QTc improved to 415 ms.  Hypokalemia/hypomagnesemia/hypophosphatemia Low electrolytes because of poor intake and hydration. Potassium low at 3 today.  Replacement ordered Recent Labs  Lab 03/07/24 1904 03/07/24 1958 03/07/24 2050 03/08/24 0559 03/09/24 0255 03/09/24 0535  K 3.9 4.0 3.7 3.3* 3.0*  --   MG  1.5*  --   --  1.7  --  1.7  PHOS  --   --   --  2.1*  --  2.5   Acute medical encephalopathy Altered mental status at presentation likely due to dehydration. CT head in the past had also shown atrophy and small vessel ischemic changes.   Mental status seems to be gradually improving.  Alert, awake, oriented to place and person today.  Not restless or agitated. Continue to monitor mental status change. At patient's daughter request, I put in referral for outpatient neurology evaluation for possible dementia  Type 2 diabetes mellitus with hyperglycemia A1c seems controlled at 6.5 on 03/07/2024 But glucose level was elevated to over 200 at presentation PTA meds-Lantus  30 units daily, metformin  1000 mg daily, Ozempic weekly, Currently on Lantus  15 units nightly.  Blood sugar level at 71 this morning.   Since patient is on Ozempic now, may not need lower dose of long-term insulin . I will reduce Lantus  to 5 units nightly and resume metformin  1000 mg daily Continue SSI/Accu-Cheks Recent Labs  Lab 03/08/24 1134 03/08/24 1615 03/08/24 2142 03/09/24 0619 03/09/24 1111  GLUCAP 153* 178* 161* 71 193*   Hypertension PTA meds-  lisinopril  10 mg daily, Lasix  20 mg daily PRN Currently both on hold due to AKI  Heart rate and blood pressure seem better today.   IV hydralazine as needed.  Hyperlipidemia Continue aspirin 81 mg daily.  Lipitor on hold because of rhabdomyolysis  Mild anemia No active bleeding.  No clear iron, folic acid or vitamin B12 deficiency noted. Continue to monitor Recent Labs    03/07/24 1904 03/07/24 1958 03/07/24 2050 03/08/24 0559 03/08/24 1519 03/09/24 0255  HGB 11.7* 10.5* 11.2* 11.2*  --  10.9*  MCV 85.8  --   --  85.3  --  84.2  VITAMINB12  --   --   --   --  923*  --   FOLATE >20.0  --   --   --   --   --   FERRITIN 106  --   --   --   --   --   TIBC 267  --   --   --   --   --   IRON 17*  --   --   --   --   --    Generalized weakness Failure to  thrive PT eval.  Nutrition eval. May need placement   Mobility:  PT Orders: Active   PT Follow up Rec: Acute Inpatient Rehab (3hours/Day)03/08/2024 1200   Goals of care   Code Status: Full Code     DVT prophylaxis:  SCDs Start: 03/07/24 2329   Antimicrobials: None Fluid: Currently on LR at 100 mL/h to continue Consultants: None Family Communication: Daughter at bedside  Status: Inpatient Level of care:  Med-Surg   Patient is from: Home Needs to continue in-hospital care: Continue IV fluid.  Repeat CK tomorrow Anticipated d/c to: Pending clinical course, CIR versus SNF    Diet:  Diet Order  Diet regular Room service appropriate? Yes; Fluid consistency: Thin  Diet effective now                   Scheduled Meds:  aspirin EC  81 mg Oral Daily   feeding supplement  237 mL Oral BID BM   insulin  aspart  0-5 Units Subcutaneous QHS   insulin  aspart  0-9 Units Subcutaneous TID WC   insulin  glargine  5 Units Subcutaneous Q2200   melatonin  5 mg Oral QHS   metFORMIN   1,000 mg Oral Daily   thiamine  100 mg Per Tube Daily    PRN meds: acetaminophen  **OR** acetaminophen , benzonatate, bismuth subsalicylate, morphine injection, ondansetron (ZOFRAN) IV, oxyCODONE   Infusions:   lactated ringers 100 mL/hr at 03/09/24 1207    Antimicrobials: Anti-infectives (From admission, onward)    Start     Dose/Rate Route Frequency Ordered Stop   03/08/24 0045  doxycycline (VIBRAMYCIN) 100 mg in sodium chloride  0.9 % 250 mL IVPB  Status:  Discontinued        100 mg 125 mL/hr over 120 Minutes Intravenous Every 12 hours 03/08/24 0021 03/09/24 1011   03/07/24 2359  cefTRIAXone (ROCEPHIN) 1 g in sodium chloride  0.9 % 100 mL IVPB  Status:  Discontinued        1 g 200 mL/hr over 30 Minutes Intravenous Daily at bedtime 03/07/24 2330 03/09/24 1011   03/07/24 2359  azithromycin (ZITHROMAX) 500 mg in sodium chloride  0.9 % 250 mL IVPB  Status:  Discontinued        500  mg 250 mL/hr over 60 Minutes Intravenous Daily at bedtime 03/07/24 2330 03/08/24 0021   03/07/24 2100  piperacillin-tazobactam (ZOSYN) IVPB 3.375 g        3.375 g 100 mL/hr over 30 Minutes Intravenous  Once 03/07/24 2057 03/07/24 2135       Objective: Vitals:   03/09/24 0743 03/09/24 1318  BP: 137/70 137/68  Pulse: 78 86  Resp: 16   Temp: 97.6 F (36.4 C) 98.6 F (37 C)  SpO2: 100% 100%    Intake/Output Summary (Last 24 hours) at 03/09/2024 1402 Last data filed at 03/09/2024 0911 Gross per 24 hour  Intake 1779.08 ml  Output 1900 ml  Net -120.92 ml   Filed Weights   03/09/24 1223  Weight: 54.4 kg   Weight change:  Body mass index is 21.94 kg/m.   Physical Exam: General exam: Pleasant, elderly Caucasian male.  Not in distress. Skin: No rashes, lesions or ulcers. HEENT: Atraumatic, normocephalic, no obvious bleeding Lungs: Clear to auscultation bilaterally,  CVS: S1, S2, no murmur,   GI/Abd: Soft, nontender, nondistended, bowel sound present,   CNS: Alert, awake, oriented to place and person Psychiatry: Mood appropriate Extremities: Chronic stasis changes but no edema on both legs.  Warm, no calf tenderness  Data Review: I have personally reviewed the laboratory data and studies available.  F/u labs  Unresulted Labs (From admission, onward)     Start     Ordered   03/10/24 0500  Basic metabolic panel with GFR  Tomorrow morning,   R        03/09/24 1017   03/10/24 0500  CK  Tomorrow morning,   R        03/09/24 1017   03/09/24 0752  Vitamin B1  Add-on,   AD        03/09/24 9247            Signed, Baird Polinski, MD [facility] 03/09/2024

## 2024-03-09 NOTE — Plan of Care (Signed)

## 2024-03-09 NOTE — Progress Notes (Signed)
 Initial Nutrition Assessment  DOCUMENTATION CODES:   Severe malnutrition in context of social or environmental circumstances  INTERVENTION:  Ensure Plus High Protein po BID, each supplement provides 350 kcal and 20 grams of protein.  Magic cup TID with meals, each supplement provides 290 kcal and 9 grams of protein  Continued thiamine 100mg  supplementation for 7 days  Discussed importance of adequate protein intake to help with recovery and recommended ordering protein at every meal  Added High Calorie, High Protein Nutrition Therapy handout from [employer] to AVS  NUTRITION DIAGNOSIS:   Severe Malnutrition related to social / environmental circumstances (poor appetite related to ozempic use) as evidenced by severe fat depletion, severe muscle depletion, percent weight loss.  GOAL:   Patient will meet greater than or equal to 90% of their needs  MONITOR:   PO intake, Supplement acceptance  REASON FOR ASSESSMENT:   Consult Assessment of nutrition requirement/status  ASSESSMENT:   Pt with hx of type 2 diabetes w/ neuropathy, HLD, HTN, and ADD. Admitted after multiple falls, FTT, hallucinations, and urinary incontinence, poor oral/fluid intake; diagnosed with rhabdomyolysis and lactic acidosis.  Spoke with pt and pt's daughter who was at bedside. Pt reports improved appetite since being admitted and since he has started feeling better. No GI discomforts to report at this time.  PTA, pt was not eating well for about 9 months. Pt reports he has not had much of an appetite since his first fall back in January. Pt reports he was recently prescribed Ozempic (started in April 2025) by PCP to help control blood sugars which has only worsened pt's appetite and led to further weight loss. Pt reports eating a few times per day but eating very small amounts as he gets full quickly. Pt's daughter reports family keeps pt's fridge well stocked with home cooked meals but  pt hardly eats more than just bites of meals. Pt reports he has noticed decreased muscle function over the last few months and has not been able to be as mobile as before due to decreased muscle function and fatigue. Pt has lost 12% weight in 5 months which is clinically significant for severe malnutrition. Pt's physical exam also shows severe muscle depletion and severe fat depletion. Muscles all around are severely depleted and appear to be more depleted than fat stores which is expected for pt's on Ozempic. Suspect malnutrition in the context of starvation related to Ozempic use and decreased appetite as a result. Pt's daughter expressed concern about why pt was prescribed Ozempic since he has never had weight he could spare to lose, MD discussed medication with pt and daughter and recommended discontinuing medication use.   Pt has also showed signs of refeeding while admitted, requiring potassium and phosphorus repletion. Recommend continued daily thiamine supplementation for 7 days.    Medications: Ensure BID SSI 0-5 units nighttime daily SSI 0-9 units TID Lantus  15 units daily Metformin  Thiamine 100 mg daily Ceftriaxone  Doxycycline  Lactated ringers Potassium chloride 40 mEq  Labs: CBG x 24 hr: 71-275 mg/dL Potassium 3.0 Phosphorus 2.1 Troponin 155<--168 A1c 6.5  NUTRITION - FOCUSED PHYSICAL EXAM:  Flowsheet Row Most Recent Value  Orbital Region Severe depletion  Upper Arm Region Mild depletion  Thoracic and Lumbar Region Moderate depletion  Buccal Region Severe depletion  Temple Region Moderate depletion  Clavicle Bone Region Severe depletion  Clavicle and Acromion Bone Region Severe depletion  Scapular Bone Region Severe depletion  Dorsal Hand Severe depletion  Patellar  Region Severe depletion  Anterior Thigh Region Severe depletion  Posterior Calf Region Severe depletion  Edema (RD Assessment) None  Hair Reviewed  Eyes Reviewed  Mouth Reviewed  Skin Reviewed   Nails Reviewed    Diet Order:   Diet Order             Diet regular Room service appropriate? Yes; Fluid consistency: Thin  Diet effective now                   EDUCATION NEEDS:   Education needs have been addressed  Skin:  Skin Assessment: Reviewed RN Assessment  Last BM:  10/8  Height:   Ht Readings from Last 1 Encounters:  03/09/24 5' 2 (1.575 m)    Weight:   Wt Readings from Last 1 Encounters:  03/09/24 54.4 kg    Ideal Body Weight:  53.6 kg  BMI:  Body mass index is 21.94 kg/m.  Estimated Nutritional Needs:   Kcal:  1500-1700  Protein:  70-90g  Fluid:  1.5-1.7L    Josette Glance, MS, RDN, LDN Clinical Dietitian I Please reach out via secure chat

## 2024-03-09 NOTE — NC FL2 (Signed)
 [place]  MEDICAID FL2 LEVEL OF CARE FORM     IDENTIFICATION  Patient Name: Philip Holt Birthdate: Oct 28, 1950 Sex: male Admission Date (Current Location): 03/07/2024  [facility] and [place] Number:  Producer, television/film/video and Address:  [facility], [address] [place], [place] [address]      Provider Number: [id number]  Attending Physician Name and Address:  Arlice Reichert, MD  Relative Name and Phone Number:       Current Level of Care: Hospital Recommended Level of Care: Skilled Nursing Facility Prior Approval Number:    Date Approved/Denied:   PASRR Number: [id number]  Discharge Plan: SNF    Current Diagnoses: Patient Active Problem List   Diagnosis Date Noted   Protein-calorie malnutrition, severe 03/09/2024   Lactic acidosis 03/08/2024   AKI (acute kidney injury) 03/08/2024   Elevated troponin 03/08/2024   Acute metabolic encephalopathy 03/08/2024   Acute anemia 03/08/2024   Generalized weakness 03/08/2024   Hyperglycemia 03/08/2024   Elevated CPK 03/08/2024   Prolonged QT interval 03/08/2024   History of essential hypertension 03/08/2024   Erectile dysfunction 02/20/2014   Diabetes mellitus with ophthalmic complication (HCC) 12/27/2013   Visit for preventive health examination 06/11/2013   GERD (gastroesophageal reflux disease) 06/11/2013   Adjustment reaction 09/27/2009   Background diabetic retinopathy (HCC) 06/05/2009   DM2 (diabetes mellitus, type 2) (HCC) 01/12/2008   ONYCHOMYCOSIS, TOENAILS 01/11/2008   HLD (hyperlipidemia) 11/08/2007   ERECTILE DYSFUNCTION 11/08/2007   Essential hypertension 11/08/2007    Orientation RESPIRATION BLADDER Height & Weight     Self, Situation  Normal Incontinent Weight: 119 lb 14.9 oz (54.4 kg) Height:  5' 2 (157.5 cm)  BEHAVIORAL SYMPTOMS/MOOD NEUROLOGICAL BOWEL NUTRITION STATUS        Diet (see discharge summary)  AMBULATORY STATUS COMMUNICATION OF NEEDS Skin   Extensive Assist  Verbally Skin abrasions                       Personal Care Assistance Level of Assistance  Bathing, Feeding, Dressing, Total care Bathing Assistance: Maximum assistance Feeding assistance: Limited assistance Dressing Assistance: Maximum assistance Total Care Assistance: Maximum assistance   Functional Limitations Info  Sight, Hearing, Speech Sight Info: Adequate Hearing Info: Impaired Speech Info: Adequate    SPECIAL CARE FACTORS FREQUENCY  PT (By licensed PT), OT (By licensed OT)     PT Frequency: 5x week OT Frequency: 5x week            Contractures Contractures Info: Not present    Additional Factors Info  Code Status, Allergies, Insulin  Sliding Scale Code Status Info: full Allergies Info: NKA   Insulin  Sliding Scale Info: Novolog : see discharge summary       Current Medications (03/09/2024):  This is the current hospital active medication list Current Facility-Administered Medications  Medication Dose Route Frequency Provider Last Rate Last Admin   acetaminophen  (TYLENOL ) tablet 650 mg  650 mg Oral Q6H PRN Howerter, Justin B, DO       Or   acetaminophen  (TYLENOL ) suppository 650 mg  650 mg Rectal Q6H PRN Howerter, Justin B, DO       aspirin EC tablet 81 mg  81 mg Oral Daily Dahal, Binaya, MD   81 mg at 03/09/24 0910   benzonatate (TESSALON) capsule 200 mg  200 mg Oral TID PRN Howerter, Justin B, DO       bismuth subsalicylate (PEPTO BISMOL) 262 MG/15ML suspension 30 mL  30 mL Oral Q4H  PRN Segars, Jonathan, MD   30 mL at 03/09/24 0504   feeding supplement (ENSURE PLUS HIGH PROTEIN) liquid 237 mL  237 mL Oral BID BM Howerter, Justin B, DO       insulin  aspart (novoLOG ) injection 0-5 Units  0-5 Units Subcutaneous QHS Howerter, Justin B, DO   3 Units at 03/08/24 0054   insulin  aspart (novoLOG ) injection 0-9 Units  0-9 Units Subcutaneous TID WC Howerter, Justin B, DO   2 Units at 03/09/24 1212   insulin  glargine (LANTUS ) injection 5 Units  5 Units Subcutaneous  Q2200 Dahal, Binaya, MD       lactated ringers infusion   Intravenous Continuous Dahal, Binaya, MD 100 mL/hr at 03/09/24 1207 New Bag at 03/09/24 1207   melatonin tablet 5 mg  5 mg Oral QHS Dahal, Chapman, MD       metFORMIN  (GLUCOPHAGE ) tablet 1,000 mg  1,000 mg Oral Daily Dahal, Binaya, MD   1,000 mg at 03/09/24 1208   morphine (PF) 2 MG/ML injection 1 mg  1 mg Intravenous Q4H PRN Dahal, Chapman, MD       ondansetron (ZOFRAN) injection 4 mg  4 mg Intravenous Q6H PRN Howerter, Justin B, DO       oxyCODONE (Oxy IR/ROXICODONE) immediate release tablet 5 mg  5 mg Oral Q6H PRN Dahal, Chapman, MD   5 mg at 03/09/24 9372   thiamine (VITAMIN B1) tablet 100 mg  100 mg Per Tube Daily Dahal, Binaya, MD   100 mg at 03/09/24 0910     Discharge Medications: Please see discharge summary for a list of discharge medications.  Relevant Imaging Results:  Relevant Lab Results:   Additional Information SSN: [id number]  Bridget Cordella Simmonds, LCSW

## 2024-03-10 DIAGNOSIS — N179 Acute kidney failure, unspecified: Secondary | ICD-10-CM | POA: Diagnosis not present

## 2024-03-10 DIAGNOSIS — M6282 Rhabdomyolysis: Secondary | ICD-10-CM | POA: Diagnosis not present

## 2024-03-10 LAB — BASIC METABOLIC PANEL WITH GFR
Anion gap: 12 (ref 5–15)
BUN: 8 mg/dL (ref 8–23)
CO2: 22 mmol/L (ref 22–32)
Calcium: 8.5 mg/dL — ABNORMAL LOW (ref 8.9–10.3)
Chloride: 102 mmol/L (ref 98–111)
Creatinine, Ser: 0.65 mg/dL (ref 0.61–1.24)
GFR, Estimated: >60 mL/min (ref >60)
Glucose, Bld: 215 mg/dL — ABNORMAL HIGH (ref 70–99)
Potassium: 3.5 mmol/L (ref 3.5–5.1)
Sodium: 136 mmol/L (ref 135–145)

## 2024-03-10 LAB — GLUCOSE, CAPILLARY
Glucose-Capillary: 191 mg/dL — ABNORMAL HIGH (ref 70–99)
Glucose-Capillary: 141 mg/dL — ABNORMAL HIGH (ref 70–99)
Glucose-Capillary: 216 mg/dL — ABNORMAL HIGH (ref 70–99)
Glucose-Capillary: 138 mg/dL — ABNORMAL HIGH (ref 70–99)

## 2024-03-10 LAB — CK: Total CK: 322 U/L (ref 49–397)

## 2024-03-10 MED ORDER — THIAMINE MONONITRATE 100 MG PO TABS
100.0000 mg | ORAL_TABLET | Freq: Every day | ORAL | Status: DC
Start: 2024-03-11 — End: 2024-03-13
  Administered 2024-03-11 – 2024-03-13 (×3): 100 mg via ORAL
  Filled 2024-03-10 (×3): qty 1

## 2024-03-10 MED ORDER — INSULIN GLARGINE 100 UNIT/ML ~~LOC~~ SOLN
10.0000 [IU] | Freq: Every day | SUBCUTANEOUS | Status: DC
Start: 2024-03-10 — End: 2024-03-13
  Administered 2024-03-10 – 2024-03-12 (×3): 10 [IU] via SUBCUTANEOUS
  Filled 2024-03-10 (×4): qty 0.1

## 2024-03-10 NOTE — Plan of Care (Signed)
  Problem: Coping: Goal: Ability to adjust to condition or change in health will improve Outcome: Progressing   Problem: Education: Goal: Knowledge of General Education information will improve Description: Including pain rating scale, medication(s)/side effects and non-pharmacologic comfort measures Outcome: Progressing   Problem: Health Behavior/Discharge Planning: Goal: Ability to manage health-related needs will improve Outcome: Progressing   Problem: Pain Managment: Goal: General experience of comfort will improve and/or be controlled Outcome: Progressing   Problem: Safety: Goal: Ability to remain free from injury will improve Outcome: Progressing

## 2024-03-10 NOTE — Progress Notes (Signed)
 Progress Note   Patient: Philip Holt FMW:[id number] DOB: [birth year] DOA: 03/07/2024     3 DOS: the patient was seen and examined on 03/10/2024   Brief hospital course: Orvin CALEN GEISTER is a 73 y.o. male with a past medical history significant for T2DM, HTN, HLD, diabetic retinopathy, ADHD, diabetic neuropathy, who presented to [facility] ED on 03/07/2024 from home with complaints of multiple falls, failure to thrive, hallucinations, and altered mental status.  Per family's report, patient mental status and appetite have been deteriorating over the last week.   In the ED, the patient was afebrile, tachycardic to 110s, blood pressure stable, maintaining appropriate SpO2 on RA.  Labs showed WBC count of 10.5, hemoglobin 11.7, glucose 326, magnesium 1.5, normal renal function.  CK was elevated to 536.  Troponin elevated to 168.  TSH within normal limits.  RSV/COVID/flu negative.  Blood cultures were obtained.  Lactate was elevated 2.3.  UA was remarkable for moderate glucose and positive ketones.   CXR showed mild patchy left basilar airspace opacity, atelectasis versus infiltrate. CTA head and neck was negative for LVO, showed 70% proximal ICA stenosis, severe right and moderate left cavernous ICA stenosis, and moderate right vertebral artery origin stenosis.   EKG showed junctional tachycardia with a ventricular rate of 119 and prolonged QTc to 528 ms.    Patient received IV fluids and Zosyn in the ED.  EDP discussed elevated troponins with cardiology who indicated etiology likely associated to rhabdomyolysis and not primary ACS.  Patient was admitted for further management.  Assessment and Plan:  #Acute metabolic encephalopathy -- resolved - Likely due to dehydration metabolic disturbances - Mental status appears to be at baseline --alert, oriented to person place and time - Outpatient neurology referral placed per patient's request for possible dementia evaluation  #Rhabdomyolysis -- resolved - CK  mildly elevated on admission to 536 - Felt to be likely secondary to immobility and generalized weakness - UA positive for hemoglobin without RBCs concerning for myoglobinuria - Improved with continuous IV hydration - CK within normal limits today  #AKI --resolved - Creatinine on admission 1.11, which is elevated compared to his most recent baseline of 0.66 in 07/2023. - Etiology most likely prerenal secondary to dehydration and decreased p.o. intake, with potential for exacerbation from patient's outpatient medications including Lasix  and lisinopril . - Improved with continuous IV hydration - Creatinine currently stable and at baseline  #Left lower lobe opacity - CXR with mild patchy basilar opacity; likely atelectasis not pneumonia as patient was afebrile with normal WBC count, low procalcitonin, negative viral panel - Was empirically started antibiotics on admission.  Received 3 days of doxycycline and 2 days of Rocephin which were stopped on 03/09/2024 - Blood culture shows no growth to date  # Lactic acidosis -- resolved - Electrolytes elevation likely due to dehydration and AKI - Resolved with IV hydration  #Prolonged QTc -- resolved - Initial QTc 528 ms - Electrolyte derangements possibly contributing factor - Repeat EKG on 03/09/2024 showed QTc improved to 415 ms  #Electrolyte abnormalities - Hypokalemia, hypomagnesemia, and hypophosphatemia were repleted and resolved   #T2DM - Hemoglobin A1c 6.5 (03/07/2024) - Home regimen includes Lantus  30 units daily, metformin  1000 mg daily, Ozempic weekly - Lantus  was reduced to 5 units nightly yesterday -- fasting BG 215 this morning - Will increase Lantus  to 10 units nightly - Continue metformin  1000 mg daily - Continue SSI and ACHS Accu-Cheks  #Hypertension - Home meds: Lisinopril  10 mg daily -- held due  to AKI - BP stable - PRN IV hydralazine  # Hyperlipidemia - Home Lipitor held in the setting of rhabdo  #Generalized  weakness PT and OT following -- recommending SNF placement  #Severe protein calorie malnutrition - Continue Ensure Enlive and Magic cup  Subjective: Patient was seen and examined this morning.  No acute events overnight per patient or RN staff.  Patient was seen sitting in bedside chair.  Son-in-law was at bedside as well.  Patient was very pleasant and reported doing very well, stated he is ready to go home.  He appears to be alert and oriented x 3.    Physical Exam: Vitals:   03/09/24 2025 03/10/24 0429 03/10/24 0759 03/10/24 1438  BP: 134/76 (!) 157/75 128/86 135/71  Pulse: 93 90 92 96  Resp: 16 15 16 16   Temp: 98.6 F (37 C) 98.4 F (36.9 C) 97.7 F (36.5 C) 98.1 F (36.7 C)  TempSrc: Oral Oral Oral Oral  SpO2: 97% 93% 99% 100%  Weight:      Height:       Physical Exam Constitutional:      General: He is not in acute distress.    Appearance: He is underweight. He is not ill-appearing.  HENT:     Mouth/Throat:     Mouth: Mucous membranes are moist.  Eyes:     Pupils: Pupils are equal, round, and reactive to light.  Cardiovascular:     Rate and Rhythm: Regular rhythm. Tachycardia present.     Heart sounds: Normal heart sounds. No murmur heard. Pulmonary:     Effort: Pulmonary effort is normal. No respiratory distress.     Breath sounds: Normal breath sounds. No wheezing.  Abdominal:     General: Bowel sounds are normal. There is no distension.     Palpations: Abdomen is soft.     Tenderness: There is no abdominal tenderness. There is no guarding.  Musculoskeletal:     Right lower leg: No edema.     Left lower leg: No edema.     Comments: Bilateral lower extremities with evidence of chronic venous stasis changes  Skin:    General: Skin is warm and dry.     Capillary Refill: Capillary refill takes less than 2 seconds.  Neurological:     Mental Status: He is alert and oriented to person, place, and time. Mental status is at baseline.    Family Communication:  Discussed with patient and son-in-law at bedside  Disposition: Status is: Inpatient Remains inpatient appropriate because: Pending discharge to SNF  Planned Discharge Destination: Skilled nursing facility    Time spent: 45 minutes  Author: Duffy Larch, MD 03/10/2024 7:01 PM  For on call review [email or web address].

## 2024-03-10 NOTE — Progress Notes (Signed)
 Physical Therapy Treatment Patient Details Name: Philip Holt MRN: [id number] DOB: 08/29/1950 Today's Date: 03/10/2024   History of Present Illness Pt is a 73 y.o. male who presented 03/07/24 with multiple falls, failure to thrive, visual hallucination, and urinary incontinence. Pt admitted with rhabdomyolysis, AKI, and acute medical encephalopathy. PMH: DM2, HTN, HLD, diabetic neuropathy, retinopathy, ADD, noncompliance to medicines    PT Comments  Pt much improved from both cognitive and functional stand point. Pt continues to require modA for transfer to EOB but was able to amb 60' with RW and minA this date. Pt remains very unsteady, at significant fall risk, and requires maxA for ADLs. Pt to benefit from inpatient rehab program < 3hrs a day to achieve safe mod I level of function for safe transition home.    If plan is discharge home, recommend the following: Two people to help with walking and/or transfers;Two people to help with bathing/dressing/bathroom;Assistance with cooking/housework;Direct supervision/assist for medications management;Direct supervision/assist for financial management;Assist for transportation;Help with stairs or ramp for entrance;Supervision due to cognitive status   Can travel by private vehicle     Yes  Equipment Recommendations  Rolling walker (2 wheels) (pending progress)    Recommendations for Other Services       Precautions / Restrictions Precautions Precautions: Fall Recall of Precautions/Restrictions: Impaired Restrictions Weight Bearing Restrictions Per Provider Order: No     Mobility  Bed Mobility Overal bed mobility: Needs Assistance Bed Mobility: Supine to Sit     Supine to sit: Mod assist, HOB elevated, Used rails     General bed mobility comments: increased time, constant directional verbal cues, modA for trunk elevation and to scoot to EOB    Transfers Overall transfer level: Needs assistance Equipment used: Rolling walker (2  wheels) Transfers: Sit to/from Stand Sit to Stand: From elevated surface, Mod assist           General transfer comment: verbal cues to push up on walker, modA for anterior weight shift to power up    Ambulation/Gait Ambulation/Gait assistance: Max assist, +2 safety/equipment Gait Distance (Feet): 60 Feet Assistive device: Rolling walker (2 wheels) Gait Pattern/deviations: Decreased step length - right, Decreased step length - left, Decreased stride length, Decreased weight shift to right, Decreased weight shift to left, Trunk flexed, Step-through pattern Gait velocity: reduced Gait velocity interpretation: <1.31 ft/sec, indicative of household ambulator   General Gait Details: pt with decreased cadence, step height, and length. minA to continue forward momentum of walker, vears to the [employer]	[place]             Wheelchair Mobility     Tilt Bed    Modified Rankin (Stroke Patients Only)       Balance Overall balance assessment: Needs assistance Sitting-balance support: Single extremity supported, Feet supported Sitting balance-Leahy Scale: Fair Sitting balance - Comments: close guard for static sitting Postural control: Right lateral lean Standing balance support: Bilateral upper extremity supported Standing balance-Leahy Scale: Poor Standing balance comment: B UE support of walker and min assist                            Communication Communication Communication: No apparent difficulties  Cognition Arousal: Alert Behavior During Therapy: WFL for tasks assessed/performed   PT - Cognitive impairments: Attention, Sequencing, Initiation, Problem solving, Safety/Judgement                       PT -  Cognition Comments: pt A&Ox4 this date. tangential and resorts to comedy in conversation Following commands: Impaired Following commands impaired: Follows one step commands inconsistently, Follows one step commands with increased time    Cueing  Cueing Techniques: Verbal cues, Tactile cues, Gestural cues  Exercises      General Comments        Pertinent Vitals/Pain Pain Assessment Pain Assessment: Faces Faces Pain Scale: Hurts a little bit Pain Location: L shoulder Pain Descriptors / Indicators: Discomfort, Grimacing, Guarding    Home Living                          Prior Function            PT Goals (current goals can now be found in the care plan section) Acute Rehab PT Goals Patient Stated Goal: to not fall PT Goal Formulation: With patient Time For Goal Achievement: 03/22/24 Potential to Achieve Goals: Good Progress towards PT goals: Progressing toward goals    Frequency    Min 3X/week      PT Plan      Co-evaluation              AM-PAC PT 6 Clicks Mobility   Outcome Measure  Help needed turning from your back to your side while in a flat bed without using bedrails?: A Lot Help needed moving from lying on your back to sitting on the side of a flat bed without using bedrails?: A Lot Help needed moving to and from a bed to a chair (including a wheelchair)?: A Lot Help needed standing up from a chair using your arms (e.g., wheelchair or bedside chair)?: A Lot Help needed to walk in hospital room?: A Lot Help needed climbing 3-5 steps with a railing? : Total 6 Click Score: 11    End of Session Equipment Utilized During Treatment: Gait belt Activity Tolerance: Patient tolerated treatment well Patient left: with nursing/sitter in room;in chair;with chair alarm set Nurse Communication: Mobility status PT Visit Diagnosis: Unsteadiness on feet (R26.81);Other abnormalities of gait and mobility (R26.89);Muscle weakness (generalized) (M62.81);History of falling (Z91.81);Repeated falls (R29.6);Difficulty in walking, not elsewhere classified (R26.2);Adult, failure to thrive (R62.7)     Time: 1108-1130 PT Time Calculation (min) (ACUTE ONLY): 22 min  Charges:    $Gait Training: 8-22  mins PT General Charges $$ ACUTE PT VISIT: 1 Visit                     Norene Ames, PT, DPT Acute Rehabilitation Services Secure chat preferred Office #: [phone]    Norene CHRISTELLA Ames 03/10/2024, 1:35 PM

## 2024-03-10 NOTE — TOC Progression Note (Addendum)
 Transition of Care [facility]) - Progression Note    Patient Details  Name: Philip Holt MRN: [id number] Date of Birth: 27-Dec-1950  Transition of Care [facility]) CM/SW Contact  Bridget Cordella Simmonds, LCSW Phone Number: 03/10/2024, 2:26 PM  Clinical Narrative: Bed offers provided to pt and son in law. They will review.    Medicare payer with inpt order on 03/07/24.   Expected Discharge Plan: Skilled Nursing Facility Barriers to Discharge: Continued Medical Work up               Expected Discharge Plan and Services   Discharge Planning Services: CM Consult   Living arrangements for the past 2 months: Single Family Home                                       Social Drivers of Health (SDOH) Interventions SDOH Screenings   Food Insecurity: Patient Unable To Answer (03/08/2024)  Housing: Unknown (03/08/2024)  Transportation Needs: Patient Unable To Answer (03/08/2024)  Utilities: Patient Unable To Answer (03/08/2024)  Financial Resource Strain: Medium Risk (11/11/2023)   Received from [facility]  Physical Activity: Unknown (11/11/2023)   Received from [facility]  Social Connections: Patient Unable To Answer (03/08/2024)  Stress: Patient Declined (11/11/2023)   Received from [facility]  Tobacco Use: Medium Risk (03/07/2024)    Readmission Risk Interventions     No data to display

## 2024-03-10 NOTE — Hospital Course (Signed)
 Philip Holt is a 73 y.o. male with a past medical history significant for T2DM, HTN, HLD, diabetic retinopathy, ADHD, diabetic neuropathy, who presented to [facility] ED on 03/07/2024 from home with complaints of multiple falls, failure to thrive, hallucinations, and altered mental status.  Per family's report, patient mental status and appetite have been deteriorating over the last week.   In the ED, the patient was afebrile, tachycardic to 110s, blood pressure stable, maintaining appropriate SpO2 on RA.  Labs showed WBC count of 10.5, hemoglobin 11.7, glucose 326, magnesium 1.5, normal renal function.  CK was elevated to 536.  Troponin elevated to 168.  TSH within normal limits.  RSV/COVID/flu negative.  Blood cultures were obtained.  Lactate was elevated 2.3.  UA was remarkable for moderate glucose and positive ketones.   CXR showed mild patchy left basilar airspace opacity, atelectasis versus infiltrate. CTA head and neck was negative for LVO, showed 70% proximal ICA stenosis, severe right and moderate left cavernous ICA stenosis, and moderate right vertebral artery origin stenosis.   EKG showed junctional tachycardia with a ventricular rate of 119 and prolonged QTc to 528 ms.    Patient received IV fluids and Zosyn in the ED.  EDP discussed elevated troponins with cardiology who indicated etiology likely associated to rhabdomyolysis and not primary ACS.  Patient was admitted for further management.

## 2024-03-10 NOTE — TOC Progression Note (Signed)
 Transition of Care [facility]) - Progression Note    Patient Details  Name: Philip Holt MRN: [id number] Date of Birth: Sep 27, 1950  [facility]) CM/SW Contact  Bridget Cordella Simmonds, LCSW Phone Number: 03/10/2024, 8:33 AM  Clinical Narrative:   Referral sent out in hub for SNF.      Expected Discharge Plan: Skilled Nursing Facility Barriers to Discharge: Continued Medical Work up               Expected Discharge Plan and Services   Discharge Planning Services: CM Consult   Living arrangements for the past 2 months: Single Family Home                                       Social Drivers of Health (SDOH) Interventions SDOH Screenings   Food Insecurity: Patient Unable To Answer (03/08/2024)  Housing: Unknown (03/08/2024)  Transportation Needs: Patient Unable To Answer (03/08/2024)  Utilities: Patient Unable To Answer (03/08/2024)  Financial Resource Strain: Medium Risk (11/11/2023)   Received from [facility]  Physical Activity: Unknown (11/11/2023)   Received from [facility]  Social Connections: Patient Unable To Answer (03/08/2024)  Stress: Patient Declined (11/11/2023)   Received from [facility]  Tobacco Use: Medium Risk (03/07/2024)    Readmission Risk Interventions     No data to display

## 2024-03-11 DIAGNOSIS — N179 Acute kidney failure, unspecified: Secondary | ICD-10-CM | POA: Diagnosis not present

## 2024-03-11 DIAGNOSIS — G9341 Metabolic encephalopathy: Secondary | ICD-10-CM | POA: Diagnosis not present

## 2024-03-11 LAB — VITAMIN B1: Vitamin B1 (Thiamine): 112.7 nmol/L (ref 66.5–200.0)

## 2024-03-11 LAB — GLUCOSE, CAPILLARY
Glucose-Capillary: 138 mg/dL — ABNORMAL HIGH (ref 70–99)
Glucose-Capillary: 173 mg/dL — ABNORMAL HIGH (ref 70–99)
Glucose-Capillary: 215 mg/dL — ABNORMAL HIGH (ref 70–99)
Glucose-Capillary: 233 mg/dL — ABNORMAL HIGH (ref 70–99)

## 2024-03-11 NOTE — Plan of Care (Signed)

## 2024-03-11 NOTE — Progress Notes (Signed)
 Spoke to pt's dtr Therisa re SNF offers and she reports pt/family prefer [facility]. Confirmed bed with [facility] in admissions. Per [facility], they are not able to admit pt until Monday. MD updated.   Julien Das, MSW, LCSW [phone] (coverage)

## 2024-03-11 NOTE — Progress Notes (Signed)
 Progress Note   Patient: Philip Holt FMW:[id number] DOB: 1950/06/30 DOA: 03/07/2024     4 DOS: the patient was seen and examined on 03/11/2024   Brief hospital course: Philip Holt is a 73 y.o. male with a past medical history significant for T2DM, HTN, HLD, diabetic retinopathy, ADHD, diabetic neuropathy, who presented to [facility] ED on 03/07/2024 from home with complaints of multiple falls, failure to thrive, hallucinations, and altered mental status.  Per family's report, patient mental status and appetite have been deteriorating over the last week.   In the ED, the patient was afebrile, tachycardic to 110s, blood pressure stable, maintaining appropriate SpO2 on RA.  Labs showed WBC count of 10.5, hemoglobin 11.7, glucose 326, magnesium 1.5, normal renal function.  CK was elevated to 536.  Troponin elevated to 168.  TSH within normal limits.  RSV/COVID/flu negative.  Blood cultures were obtained.  Lactate was elevated 2.3.  UA was remarkable for moderate glucose and positive ketones.   CXR showed mild patchy left basilar airspace opacity, atelectasis versus infiltrate. CTA head and neck was negative for LVO, showed 70% proximal ICA stenosis, severe right and moderate left cavernous ICA stenosis, and moderate right vertebral artery origin stenosis.   EKG showed junctional tachycardia with a ventricular rate of 119 and prolonged QTc to 528 ms.    Patient received IV fluids and Zosyn in the ED.  EDP discussed elevated troponins with cardiology who indicated etiology likely associated to rhabdomyolysis and not primary ACS.  Patient was admitted for further management.  Assessment and Plan:  #Acute metabolic encephalopathy -- resolved - Likely due to dehydration metabolic disturbances - Mental status appears to be at baseline --alert, oriented to person place and time - Outpatient neurology referral placed per patient's request for possible dementia evaluation  #Rhabdomyolysis -- resolved - CK  mildly elevated on admission to 536 - Felt to be likely secondary to immobility and generalized weakness - UA positive for hemoglobin without RBCs concerning for myoglobinuria - Improved with continuous IV hydration - CK within normal limits today  #AKI --resolved - Creatinine on admission 1.11, which is elevated compared to his most recent baseline of 0.66 in 07/2023. - Etiology most likely prerenal secondary to dehydration and decreased p.o. intake, with potential for exacerbation from patient's outpatient medications including Lasix  and lisinopril . - Improved with continuous IV hydration - Creatinine currently stable and at baseline  #Left lower lobe opacity - CXR with mild patchy basilar opacity; likely atelectasis not pneumonia as patient was afebrile with normal WBC count, low procalcitonin, negative viral panel - Was empirically started antibiotics on admission.  Received 3 days of doxycycline and 2 days of Rocephin which were stopped on 03/09/2024 - Blood culture shows no growth to date  # Lactic acidosis -- resolved - Electrolytes elevation likely due to dehydration and AKI - Resolved with IV hydration  #Prolonged QTc -- resolved - Initial QTc 528 ms - Electrolyte derangements possibly contributing factor - Repeat EKG on 03/09/2024 showed QTc improved to 415 ms  #Electrolyte abnormalities - Hypokalemia, hypomagnesemia, and hypophosphatemia were repleted and resolved   #T2DM - Hemoglobin A1c 6.5 (03/07/2024) - Home regimen includes Lantus  30 units daily, metformin  1000 mg daily, Ozempic weekly - Lantus  was reduced to 5 units nightly yesterday -- fasting BG 215 this morning - Will increase Lantus  to 10 units nightly - Continue metformin  1000 mg daily - Continue SSI and ACHS Accu-Cheks  #Hypertension - Home meds: Lisinopril  10 mg daily -- held due  to AKI - BP stable - PRN IV hydralazine  # Hyperlipidemia - Home Lipitor held in the setting of rhabdo  #Generalized  weakness PT and OT following -- recommending SNF placement  #Severe protein calorie malnutrition - Continue Ensure Enlive and Magic cup  Subjective: Patient was seen and examined this afternoon.  Patient's daughter, son-in-law, and 2 grandchildren are at bedside.  No acute events overnight per patient or RN staff.  Patient reported doing well overall he appears to be alert and oriented x 3.  However, he continued to exhibit some mild cognitive impairment.  He also self-reported some episodes of hallucinations have been going on and off.  He accepted a bed offer at [facility].  However, the bed will be available on Monday, 03/13/2024.  Physical Exam: Vitals:   03/10/24 2004 03/11/24 0422 03/11/24 0821 03/11/24 1401  BP: (!) 144/60 117/72 (!) 142/70 (!) 145/63  Pulse: 85 83 95 86  Resp: 17 13 19 19   Temp: 98.6 F (37 C) 99.4 F (37.4 C) 99.8 F (37.7 C) 99.3 F (37.4 C)  TempSrc:      SpO2: 99% 98% 98% 98%  Weight:      Height:       Physical Exam Constitutional:      General: He is not in acute distress.    Appearance: He is underweight. He is not ill-appearing.  HENT:     Mouth/Throat:     Mouth: Mucous membranes are moist.  Eyes:     Pupils: Pupils are equal, round, and reactive to light.  Cardiovascular:     Rate and Rhythm: Regular rhythm. Tachycardia present.     Heart sounds: Normal heart sounds. No murmur heard. Pulmonary:     Effort: Pulmonary effort is normal. No respiratory distress.     Breath sounds: Normal breath sounds. No wheezing.  Abdominal:     General: Bowel sounds are normal. There is no distension.     Palpations: Abdomen is soft.     Tenderness: There is no abdominal tenderness. There is no guarding.  Musculoskeletal:     Right lower leg: No edema.     Left lower leg: No edema.     Comments: Bilateral lower extremities with evidence of chronic venous stasis changes  Skin:    General: Skin is warm and dry.     Capillary Refill: Capillary  refill takes less than 2 seconds.  Neurological:     Mental Status: He is alert and oriented to person, place, and time. Mental status is at baseline.    Family Communication: Discussed with patient, daughter Therisa, and son-in-law at bedside  Disposition: Status is: Inpatient Remains inpatient appropriate because: [facility] on [facility]/13/2024  Planned Discharge Destination: Skilled nursing facility    Time spent: 25 minutes  Author: Duffy Larch, MD 03/11/2024 6:41 PM  For on call review [email or web address].

## 2024-03-11 NOTE — Plan of Care (Signed)
  Problem: Coping: Goal: Ability to adjust to condition or change in health will improve Outcome: Progressing   Problem: Education: Goal: Knowledge of General Education information will improve Description: Including pain rating scale, medication(s)/side effects and non-pharmacologic comfort measures Outcome: Progressing   Problem: Health Behavior/Discharge Planning: Goal: Ability to manage health-related needs will improve Outcome: Progressing   Problem: Clinical Measurements: Goal: Ability to maintain clinical measurements within normal limits will improve Outcome: Progressing   Problem: Coping: Goal: Level of anxiety will decrease Outcome: Progressing

## 2024-03-12 DIAGNOSIS — G9341 Metabolic encephalopathy: Secondary | ICD-10-CM | POA: Diagnosis not present

## 2024-03-12 DIAGNOSIS — N179 Acute kidney failure, unspecified: Secondary | ICD-10-CM | POA: Diagnosis not present

## 2024-03-12 LAB — CULTURE, BLOOD (ROUTINE X 2)
Culture: NO GROWTH
Culture: NO GROWTH
Special Requests: ADEQUATE
Special Requests: ADEQUATE

## 2024-03-12 LAB — GLUCOSE, CAPILLARY
Glucose-Capillary: 117 mg/dL — ABNORMAL HIGH (ref 70–99)
Glucose-Capillary: 254 mg/dL — ABNORMAL HIGH (ref 70–99)
Glucose-Capillary: 237 mg/dL — ABNORMAL HIGH (ref 70–99)
Glucose-Capillary: 257 mg/dL — ABNORMAL HIGH (ref 70–99)
Glucose-Capillary: 287 mg/dL — ABNORMAL HIGH (ref 70–99)

## 2024-03-12 MED ORDER — LISINOPRIL 10 MG PO TABS
10.0000 mg | ORAL_TABLET | Freq: Every day | ORAL | Status: DC
  Administered 2024-03-13: 10 mg via ORAL
  Filled 2024-03-12: qty 1

## 2024-03-12 MED ORDER — INSULIN ASPART 100 UNIT/ML IJ SOLN
3.0000 [IU] | Freq: Three times a day (TID) | INTRAMUSCULAR | Status: DC
  Administered 2024-03-13 (×2): 3 [IU] via SUBCUTANEOUS

## 2024-03-12 NOTE — Progress Notes (Addendum)
 Progress Note   Patient: Philip Holt FMW:[id number] DOB: [birth year] DOA: 03/07/2024     5 DOS: the patient was seen and examined on 03/12/2024   Brief hospital course: Philip Holt is a 73 y.o. male with a past medical history significant for T2DM, HTN, HLD, diabetic retinopathy, ADHD, diabetic neuropathy, who presented to [facility] ED on 03/07/2024 from home with complaints of multiple falls, failure to thrive, hallucinations, and altered mental status.  Per family's report, patient mental status and appetite have been deteriorating over the last week.   In the ED, the patient was afebrile, tachycardic to 110s, blood pressure stable, maintaining appropriate SpO2 on RA.  Labs showed WBC count of 10.5, hemoglobin 11.7, glucose 326, magnesium 1.5, normal renal function.  CK was elevated to 536.  Troponin elevated to 168.  TSH within normal limits.  RSV/COVID/flu negative.  Blood cultures were obtained.  Lactate was elevated 2.3.  UA was remarkable for moderate glucose and positive ketones.   CXR showed mild patchy left basilar airspace opacity, atelectasis versus infiltrate. CTA head and neck was negative for LVO, showed 70% proximal ICA stenosis, severe right and moderate left cavernous ICA stenosis, and moderate right vertebral artery origin stenosis.   EKG showed junctional tachycardia with a ventricular rate of 119 and prolonged QTc to 528 ms.    Patient received IV fluids and Zosyn in the ED.  EDP discussed elevated troponins with cardiology who indicated etiology likely associated to rhabdomyolysis and not primary ACS.  Patient was admitted for further management.  Assessment and Plan:  #Acute metabolic encephalopathy -- resolved - Likely due to dehydration metabolic disturbances - Mental status appears to be at baseline --alert, oriented to person place and time - Outpatient neurology referral placed per patient's request for possible dementia evaluation - Delirium  precautions  #Rhabdomyolysis -- resolved - CK mildly elevated on admission to 536 - Felt to be likely secondary to immobility and generalized weakness - UA positive for hemoglobin without RBCs concerning for myoglobinuria - Improved with continuous IV hydration - CK within normal limits today  #AKI --resolved - Creatinine on admission 1.11, which is elevated compared to his most recent baseline of 0.66 in 07/2023. - Etiology most likely prerenal secondary to dehydration and decreased p.o. intake, with potential for exacerbation from patient's outpatient medications including Lasix  and lisinopril . - Improved with continuous IV hydration - Creatinine currently stable and at baseline  #Left lower lobe opacity - CXR with mild patchy basilar opacity; likely atelectasis not pneumonia as patient was afebrile with normal WBC count, low procalcitonin, negative viral panel - Was empirically started antibiotics on admission.  Received 3 days of doxycycline and 2 days of Rocephin which were stopped on 03/09/2024 - Blood culture shows no growth to date  # Lactic acidosis -- resolved - Electrolytes elevation likely due to dehydration and AKI - Resolved with IV hydration  #Prolonged QTc -- resolved - Initial QTc 528 ms - Electrolyte derangements possibly contributing factor - Repeat EKG on 03/09/2024 showed QTc improved to 415 ms  #Electrolyte abnormalities - Hypokalemia, hypomagnesemia, and hypophosphatemia were repleted and resolved   #T2DM - Hemoglobin A1c 6.5 (03/07/2024) - Home regimen includes Lantus  30 units daily, metformin  1000 mg daily, Ozempic weekly - Fasting BG 117, mealtime BG > 200s - Continue Lantus  to 10 units nightly - Start Novolog  3 units premeal - Continue metformin  1000 mg daily - Continue SSI and ACHS Accu-Cheks  #Hypertension - Resumed home Lisinopril  10 mg daily -- previously  held due to AKI - PRN IV hydralazine  # Hyperlipidemia - Home Lipitor held in the setting  of rhabdo  #Generalized weakness PT and OT following -- recommending SNF placement  #Severe protein calorie malnutrition - Continue Ensure Enlive and Magic cup  Subjective: Patient was seen and examined this afternoon. Patient was laying in bed. Reported episodic confusion. Otherwise, he had no acute complaints.  He accepted a bed offer at [facility].  However, the bed will be available on Monday, 03/13/2024.  Physical Exam: Vitals:   03/12/24 0600 03/12/24 0810 03/12/24 1509 03/12/24 1959  BP: (!) 150/63 (!) 164/82 (!) 151/68 (!) 150/74  Pulse: 78 83 97 100  Resp: 18 19 19 19   Temp: 98.6 F (37 C) 98.4 F (36.9 C) 98.5 F (36.9 C) 98.7 F (37.1 C)  TempSrc: Oral   Oral  SpO2: 99% 97% 100% 99%  Weight:      Height:       Physical Exam Constitutional:      General: He is not in acute distress.    Appearance: He is underweight. He is not ill-appearing.  HENT:     Mouth/Throat:     Mouth: Mucous membranes are moist.  Eyes:     Pupils: Pupils are equal, round, and reactive to light.  Cardiovascular:     Rate and Rhythm: Normal rate and regular rhythm.     Heart sounds: Normal heart sounds. No murmur heard. Pulmonary:     Effort: Pulmonary effort is normal. No respiratory distress.     Breath sounds: Normal breath sounds. No wheezing.  Abdominal:     General: Bowel sounds are normal. There is no distension.     Palpations: Abdomen is soft.     Tenderness: There is no abdominal tenderness. There is no guarding.  Musculoskeletal:     Right lower leg: No edema.     Left lower leg: No edema.     Comments: Bilateral lower extremities with evidence of chronic venous stasis changes  Skin:    General: Skin is warm and dry.     Capillary Refill: Capillary refill takes less than 2 seconds.  Neurological:     Mental Status: He is alert and oriented to person, place, and time. Mental status is at baseline.    Family Communication: Discussed with  patient  Disposition: Status is: Inpatient Remains inpatient appropriate because: Pending discharge [facility] on [facility]/13/2024  Planned Discharge Destination: Skilled nursing facility    Time spent: 25 minutes  Author: Duffy Larch, MD 03/12/2024 11:16 PM  For on call review [email or web address].

## 2024-03-12 NOTE — Plan of Care (Signed)
  Problem: Education: Goal: Ability to describe self-care measures that may prevent or decrease complications (Diabetes Survival Skills Education) will improve Outcome: Progressing   Problem: Fluid Volume: Goal: Ability to maintain a balanced intake and output will improve Outcome: Progressing   Problem: Nutritional: Goal: Maintenance of adequate nutrition will improve Outcome: Progressing Goal: Progress toward achieving an optimal weight will improve Outcome: Progressing

## 2024-03-12 NOTE — Plan of Care (Signed)
   Problem: Coping: Goal: Ability to adjust to condition or change in health will improve Outcome: Progressing

## 2024-03-13 DIAGNOSIS — M6281 Muscle weakness (generalized): Secondary | ICD-10-CM | POA: Diagnosis not present

## 2024-03-13 DIAGNOSIS — Q541 Hypospadias, penile: Secondary | ICD-10-CM | POA: Diagnosis not present

## 2024-03-13 DIAGNOSIS — F909 Attention-deficit hyperactivity disorder, unspecified type: Secondary | ICD-10-CM | POA: Diagnosis not present

## 2024-03-13 DIAGNOSIS — R441 Visual hallucinations: Secondary | ICD-10-CM | POA: Diagnosis not present

## 2024-03-13 DIAGNOSIS — N39498 Other specified urinary incontinence: Secondary | ICD-10-CM | POA: Diagnosis not present

## 2024-03-13 DIAGNOSIS — R600 Localized edema: Secondary | ICD-10-CM | POA: Diagnosis not present

## 2024-03-13 DIAGNOSIS — S30812A Abrasion of penis, initial encounter: Secondary | ICD-10-CM | POA: Diagnosis not present

## 2024-03-13 DIAGNOSIS — E1139 Type 2 diabetes mellitus with other diabetic ophthalmic complication: Secondary | ICD-10-CM | POA: Diagnosis not present

## 2024-03-13 DIAGNOSIS — I10 Essential (primary) hypertension: Secondary | ICD-10-CM | POA: Diagnosis not present

## 2024-03-13 DIAGNOSIS — E569 Vitamin deficiency, unspecified: Secondary | ICD-10-CM | POA: Diagnosis not present

## 2024-03-13 DIAGNOSIS — Z743 Need for continuous supervision: Secondary | ICD-10-CM | POA: Diagnosis not present

## 2024-03-13 DIAGNOSIS — R531 Weakness: Secondary | ICD-10-CM | POA: Diagnosis not present

## 2024-03-13 DIAGNOSIS — E519 Thiamine deficiency, unspecified: Secondary | ICD-10-CM | POA: Diagnosis not present

## 2024-03-13 DIAGNOSIS — I872 Venous insufficiency (chronic) (peripheral): Secondary | ICD-10-CM | POA: Diagnosis not present

## 2024-03-13 DIAGNOSIS — R102 Pelvic and perineal pain unspecified side: Secondary | ICD-10-CM | POA: Diagnosis not present

## 2024-03-13 DIAGNOSIS — E1140 Type 2 diabetes mellitus with diabetic neuropathy, unspecified: Secondary | ICD-10-CM | POA: Diagnosis not present

## 2024-03-13 DIAGNOSIS — N4889 Other specified disorders of penis: Secondary | ICD-10-CM | POA: Diagnosis not present

## 2024-03-13 DIAGNOSIS — Z28310 Unvaccinated for covid-19: Secondary | ICD-10-CM | POA: Diagnosis not present

## 2024-03-13 DIAGNOSIS — L0390 Cellulitis, unspecified: Secondary | ICD-10-CM | POA: Diagnosis not present

## 2024-03-13 DIAGNOSIS — N4830 Priapism, unspecified: Secondary | ICD-10-CM | POA: Diagnosis not present

## 2024-03-13 DIAGNOSIS — R059 Cough, unspecified: Secondary | ICD-10-CM | POA: Diagnosis not present

## 2024-03-13 DIAGNOSIS — R2689 Other abnormalities of gait and mobility: Secondary | ICD-10-CM | POA: Diagnosis not present

## 2024-03-13 DIAGNOSIS — E113299 Type 2 diabetes mellitus with mild nonproliferative diabetic retinopathy without macular edema, unspecified eye: Secondary | ICD-10-CM | POA: Diagnosis not present

## 2024-03-13 DIAGNOSIS — R41841 Cognitive communication deficit: Secondary | ICD-10-CM | POA: Diagnosis not present

## 2024-03-13 DIAGNOSIS — K219 Gastro-esophageal reflux disease without esophagitis: Secondary | ICD-10-CM | POA: Diagnosis not present

## 2024-03-13 DIAGNOSIS — F3289 Other specified depressive episodes: Secondary | ICD-10-CM | POA: Diagnosis not present

## 2024-03-13 DIAGNOSIS — G9341 Metabolic encephalopathy: Secondary | ICD-10-CM | POA: Diagnosis not present

## 2024-03-13 DIAGNOSIS — F4320 Adjustment disorder, unspecified: Secondary | ICD-10-CM | POA: Diagnosis not present

## 2024-03-13 DIAGNOSIS — R739 Hyperglycemia, unspecified: Secondary | ICD-10-CM | POA: Diagnosis not present

## 2024-03-13 DIAGNOSIS — F39 Unspecified mood [affective] disorder: Secondary | ICD-10-CM | POA: Diagnosis not present

## 2024-03-13 DIAGNOSIS — M6282 Rhabdomyolysis: Secondary | ICD-10-CM | POA: Diagnosis not present

## 2024-03-13 DIAGNOSIS — E43 Unspecified severe protein-calorie malnutrition: Secondary | ICD-10-CM | POA: Diagnosis not present

## 2024-03-13 DIAGNOSIS — R197 Diarrhea, unspecified: Secondary | ICD-10-CM | POA: Diagnosis not present

## 2024-03-13 DIAGNOSIS — K5900 Constipation, unspecified: Secondary | ICD-10-CM | POA: Diagnosis not present

## 2024-03-13 DIAGNOSIS — N5201 Erectile dysfunction due to arterial insufficiency: Secondary | ICD-10-CM | POA: Diagnosis not present

## 2024-03-13 DIAGNOSIS — N179 Acute kidney failure, unspecified: Secondary | ICD-10-CM | POA: Diagnosis not present

## 2024-03-13 DIAGNOSIS — R627 Adult failure to thrive: Secondary | ICD-10-CM | POA: Diagnosis not present

## 2024-03-13 DIAGNOSIS — R1312 Dysphagia, oropharyngeal phase: Secondary | ICD-10-CM | POA: Diagnosis not present

## 2024-03-13 DIAGNOSIS — E785 Hyperlipidemia, unspecified: Secondary | ICD-10-CM | POA: Diagnosis not present

## 2024-03-13 LAB — GLUCOSE, CAPILLARY
Glucose-Capillary: 145 mg/dL — ABNORMAL HIGH (ref 70–99)
Glucose-Capillary: 198 mg/dL — ABNORMAL HIGH (ref 70–99)
Glucose-Capillary: 302 mg/dL — ABNORMAL HIGH (ref 70–99)

## 2024-03-13 MED ORDER — ENSURE PLUS HIGH PROTEIN PO LIQD: 1.0000 | Freq: Two times a day (BID) | ORAL | Status: AC

## 2024-03-13 MED ORDER — VITAMIN B-1 100 MG PO TABS: 100.0000 mg | ORAL_TABLET | Freq: Every day | ORAL | Status: AC

## 2024-03-13 MED ORDER — BISMUTH SUBSALICYLATE 262 MG/15ML PO SUSP: 30.0000 mL | ORAL | Status: AC | PRN

## 2024-03-13 MED ORDER — BENZONATATE 200 MG PO CAPS: 200.0000 mg | ORAL_CAPSULE | Freq: Three times a day (TID) | ORAL | Status: AC | PRN

## 2024-03-13 MED ORDER — INSULIN ASPART 100 UNIT/ML IJ SOLN: 3.0000 [IU] | Freq: Three times a day (TID) | INTRAMUSCULAR | Status: AC

## 2024-03-13 NOTE — Progress Notes (Signed)
 Physical Therapy Treatment Patient Details Name: Philip Holt MRN: [id number] DOB: Nov 26, 1950 Today's Date: 03/13/2024   History of Present Illness Pt is a 73 y.o. male who presented 03/07/24 with multiple falls, failure to thrive, visual hallucination, and urinary incontinence. Pt admitted with rhabdomyolysis, AKI, and acute medical encephalopathy. PMH: DM2, HTN, HLD, diabetic neuropathy, retinopathy, ADD, noncompliance to medicines    PT Comments  Pt resting in bed on arrival, pleasant and agreeable to session and demonstrating steady progress towards acute goals. Pt continues to require mod A to complete bed mobility and mod A to boost to stand with pt demonstrating posterior lean sitting up EOB and on rise to stand with pt needing increased time and hands on assist to come to midline and gain sitting and standing balance. Pt demonstrating gait with RW for support with min A to maintain balance and forward momentum with chair follow for safety. Pt up in chair at end of session with all needs met. Pt continues to benefit from skilled PT services to progress toward functional mobility goals.     If plan is discharge home, recommend the following: Two people to help with walking and/or transfers;Two people to help with bathing/dressing/bathroom;Assistance with cooking/housework;Direct supervision/assist for medications management;Direct supervision/assist for financial management;Assist for transportation;Help with stairs or ramp for entrance;Supervision due to cognitive status   Can travel by private vehicle     Yes  Equipment Recommendations  Rolling walker (2 wheels) (pending progress)    Recommendations for Other Services       Precautions / Restrictions Precautions Precautions: Fall Recall of Precautions/Restrictions: Impaired Restrictions Weight Bearing Restrictions Per Provider Order: No     Mobility  Bed Mobility Overal bed mobility: Needs Assistance Bed Mobility: Supine to  Sit     Supine to sit: Mod assist, HOB elevated, Used rails     General bed mobility comments: increased time, constant directional verbal cues, modA for trunk elevation and to scoot to EOB    Transfers Overall transfer level: Needs assistance Equipment used: Rolling walker (2 wheels) Transfers: Sit to/from Stand Sit to Stand: Mod assist           General transfer comment: verbal cues to push up on walker, modA for anterior weight shift to power up    Ambulation/Gait Ambulation/Gait assistance: +2 safety/equipment, Min assist Gait Distance (Feet): 80 Feet Assistive device: Rolling walker (2 wheels) Gait Pattern/deviations: Decreased step length - right, Decreased step length - left, Decreased stride length, Decreased weight shift to right, Decreased weight shift to left, Trunk flexed, Step-through pattern Gait velocity: reduced     General Gait Details: pt with decreased cadence, step height, and length. minA to continue forward momentum of walker, able to increase stride length with cues but unable to maintain, cues for closer RW proximity with poor ability to correct   Stairs             Wheelchair Mobility     Tilt Bed    Modified Rankin (Stroke Patients Only)       Balance Overall balance assessment: Needs assistance Sitting-balance support: Single extremity supported, Feet supported Sitting balance-Leahy Scale: Fair Sitting balance - Comments: close guard for static sitting   Standing balance support: Bilateral upper extremity supported Standing balance-Leahy Scale: Poor Standing balance comment: B UE support of walker and min assist                            Communication  Communication Communication: No apparent difficulties  Cognition Arousal: Alert Behavior During Therapy: WFL for tasks assessed/performed   PT - Cognitive impairments: Attention, Sequencing, Initiation, Problem solving, Safety/Judgement                        PT - Cognition Comments: pt A&Ox4 this date. tangential and resorts to comedy in conversation Following commands: Impaired Following commands impaired: Follows one step commands inconsistently, Follows one step commands with increased time    Cueing Cueing Techniques: Verbal cues, Tactile cues, Gestural cues  Exercises      General Comments General comments (skin integrity, edema, etc.): VSS on RA      Pertinent Vitals/Pain Pain Assessment Pain Assessment: No/denies pain Pain Intervention(s): Monitored during session    Home Living                          Prior Function            PT Goals (current goals can now be found in the care plan section) Acute Rehab PT Goals Patient Stated Goal: to not fall PT Goal Formulation: With patient Time For Goal Achievement: 03/22/24 Progress towards PT goals: Progressing toward goals    Frequency    Min 3X/week      PT Plan      Co-evaluation              AM-PAC PT 6 Clicks Mobility   Outcome Measure  Help needed turning from your back to your side while in a flat bed without using bedrails?: A Lot Help needed moving from lying on your back to sitting on the side of a flat bed without using bedrails?: A Lot Help needed moving to and from a bed to a chair (including a wheelchair)?: A Lot Help needed standing up from a chair using your arms (e.g., wheelchair or bedside chair)?: A Lot Help needed to walk in hospital room?: A Lot Help needed climbing 3-5 steps with a railing? : Total 6 Click Score: 11    End of Session Equipment Utilized During Treatment: Gait belt Activity Tolerance: Patient tolerated treatment well Patient left: with chair alarm set;in chair;with call bell/phone within reach Nurse Communication: Mobility status PT Visit Diagnosis: Unsteadiness on feet (R26.81);Other abnormalities of gait and mobility (R26.89);Muscle weakness (generalized) (M62.81);History of falling  (Z91.81);Repeated falls (R29.6);Difficulty in walking, not elsewhere classified (R26.2);Adult, failure to thrive (R62.7)     Time: 9040-8977 PT Time Calculation (min) (ACUTE ONLY): 23 min  Charges:    $Gait Training: 23-37 mins PT General Charges $$ ACUTE PT VISIT: 1 Visit                     Henri Baumler R. PTA Acute Rehabilitation Services Office: [phone]   Therisa CHRISTELLA Boor 03/13/2024, 10:42 AM

## 2024-03-13 NOTE — Discharge Summary (Signed)
 Physician Discharge Summary   Patient: CORDIE Holt MRN: [id number] DOB: 09-Sep-1950  Admit date:     03/07/2024  Discharge date: 03/13/24  Discharge Physician: Duffy Al-Sultani   PCP: Husain, Karrar, MD   Recommendations at discharge:   Monitor BG levels and adjust insulin  regimen as necessary Delirium precautions as patient can get confused easily Follow up with neurology for evaluation of dementia per family's request  Discharge Diagnoses: Principal Problem:   AKI (acute kidney injury) Active Problems:   DM2 (diabetes mellitus, type 2) (HCC)   HLD (hyperlipidemia)   Lactic acidosis   Elevated troponin   Acute metabolic encephalopathy   Acute anemia   Generalized weakness   Hyperglycemia   Elevated CPK   Prolonged QT interval   History of essential hypertension   Protein-calorie malnutrition, severe  Resolved Problems:   CAP (community acquired pneumonia)  Hospital Course: Philip Holt is a 73 y.o. male with a past medical history significant for T2DM, HTN, HLD, diabetic retinopathy, ADHD, diabetic neuropathy, who presented to [facility] ED on 03/07/2024 from home with complaints of multiple falls, failure to thrive, hallucinations, and altered mental status.  Per family's report, patient mental status and appetite have been deteriorating over the last week.   In the ED, the patient was afebrile, tachycardic to 110s, blood pressure stable, maintaining appropriate SpO2 on RA.  Labs showed WBC count of 10.5, hemoglobin 11.7, glucose 326, magnesium 1.5, normal renal function.  CK was elevated to 536.  Troponin elevated to 168.  TSH within normal limits.  RSV/COVID/flu negative.  Blood cultures were obtained.  Lactate was elevated 2.3.  UA was remarkable for moderate glucose and positive ketones.   CXR showed mild patchy left basilar airspace opacity, atelectasis versus infiltrate. CTA head and neck was negative for LVO, showed 70% proximal ICA stenosis, severe right and moderate left  cavernous ICA stenosis, and moderate right vertebral artery origin stenosis.   EKG showed junctional tachycardia with a ventricular rate of 119 and prolonged QTc to 528 ms.    Patient received IV fluids and Zosyn in the ED.  EDP discussed elevated troponins with cardiology who indicated etiology likely associated to rhabdomyolysis and not primary ACS.  Patient was admitted for further management.  Assessment and Plan: #Acute metabolic encephalopathy -- resolved - Likely due to dehydration metabolic disturbances - Mental status appears to be at baseline --alert, oriented to person place and time - Outpatient neurology referral placed per patient's request for possible dementia evaluation - Delirium precautions   #Rhabdomyolysis -- resolved - CK mildly elevated on admission to 536 - Felt to be likely secondary to immobility and generalized weakness - UA positive for hemoglobin without RBCs concerning for myoglobinuria - Improved with continuous IV hydration - CK down to normal limits on 10/10   #AKI --resolved - Creatinine on admission 1.11, which is elevated compared to his most recent baseline of 0.66 in 07/2023. - Etiology most likely prerenal secondary to dehydration and decreased p.o. intake, with potential for exacerbation from patient's outpatient medications including Lasix  and lisinopril . - Improved with continuous IV hydration - Creatinine currently stable and at baseline   #Left lower lobe opacity - CXR with mild patchy basilar opacity; likely atelectasis not pneumonia as patient was afebrile with normal WBC count, low procalcitonin, negative viral panel - Was empirically started antibiotics on admission.  Received 3 days of doxycycline and 2 days of Rocephin which were stopped on 03/09/2024 - Blood culture showed no growth to date   #  Lactic acidosis -- resolved - Electrolytes elevation likely due to dehydration and AKI - Resolved with IV hydration   #Prolonged QTc --  resolved - Initial QTc 528 ms - Electrolyte derangements possibly contributing factor - Repeat EKG on 03/09/2024 showed QTc improved to 415 ms   #Electrolyte abnormalities - Hypokalemia, hypomagnesemia, and hypophosphatemia were repleted and resolved    #T2DM - Hemoglobin A1c 6.5 (03/07/2024) - Home regimen includes Lantus  30 units daily, metformin  1000 mg daily, Ozempic weekly - Will discharge to SNF on Lantus  10 units nightly, Novolog  3 units premeal, metformin  1000 mg daily and resume Ozempic weekly. Will likely need this regimen adjusted again by SNF MD.    #Hypertension - Continue home Lisinopril  10 mg daily -- previously held due to AKI   # Hyperlipidemia - Home Lipitor held in the setting of rhabdo initially  - Resumed as rhabdo has resolved   #Generalized weakness PT and OT following -- recommending SNF placement   #Severe protein calorie malnutrition - Continue Ensure Enlive and Magic cup       Consultants: None Procedures performed: None Disposition: Skilled nursing facility Diet recommendation:  Carb modified diet with Ensure Enlive and Magic cup supplementation DISCHARGE MEDICATION: Allergies as of 03/13/2024   No Known Allergies      Medication List     STOP taking these medications    furosemide  20 MG tablet Commonly known as: LASIX    Synjardy XR 25-1000 MG Tb24 Generic drug: Empagliflozin-metFORMIN  HCl ER       TAKE these medications    aspirin EC 81 MG tablet Take 81 mg by mouth daily. Swallow whole.   atorvastatin 40 MG tablet Commonly known as: LIPITOR Take 40 mg by mouth daily.   benzonatate 200 MG capsule Commonly known as: TESSALON Take 1 capsule (200 mg total) by mouth 3 (three) times daily as needed for cough.   bismuth subsalicylate 262 MG/15ML suspension Commonly known as: PEPTO BISMOL Take 30 mLs by mouth every 4 (four) hours as needed for diarrhea or loose stools.   co-enzyme Q-10 30 MG capsule Take 30 mg by mouth  daily.   feeding supplement Liqd Take 237 mLs by mouth 2 (two) times daily between meals.   insulin  aspart 100 UNIT/ML injection Commonly known as: novoLOG  Inject 3 Units into the skin 3 (three) times daily with meals.   Lantus  SoloStar 100 UNIT/ML Solostar Pen Generic drug: insulin  glargine Inject 30 Units into the skin daily.   lisinopril  10 MG tablet Commonly known as: ZESTRIL  Take 10 mg by mouth daily.   metFORMIN  1000 MG tablet Commonly known as: GLUCOPHAGE  Take 1,000 mg by mouth daily.   MULTIVITAMIN PO Take 1 tablet by mouth daily.   Semaglutide (1 MG/DOSE) 4 MG/3ML Sopn Inject 1 mg into the skin once a week.   thiamine 100 MG tablet Commonly known as: Vitamin B-1 Take 1 tablet (100 mg total) by mouth daily. Start taking on: March 14, 2024        Contact information for after-discharge care     Destination     [facility]   Service: Skilled Nursing Contact information: [address] [facility] [place]  [phone]                    Discharge Exam: Fredricka Weights   03/09/24 1223 03/13/24 0500  Weight: 54.4 kg 54.6 kg   Constitutional:      General: He is not in acute distress.  Appearance: He is underweight. He is not ill-appearing.  HENT:     Mouth/Throat:     Mouth: Mucous membranes are moist.  Eyes:     Pupils: Pupils are equal, round, and reactive to light.  Cardiovascular:     Rate and Rhythm: Normal rate and regular rhythm.     Heart sounds: Normal heart sounds. No murmur heard. Pulmonary:     Effort: Pulmonary effort is normal. No respiratory distress.     Breath sounds: Normal breath sounds. No wheezing.  Abdominal:     General: Bowel sounds are normal. There is no distension.     Palpations: Abdomen is soft.     Tenderness: There is no abdominal tenderness. There is no guarding.  Musculoskeletal:     Right lower leg: No edema.     Left lower leg: No edema.     Comments: Bilateral lower  extremities with evidence of chronic venous stasis changes  Skin:    General: Skin is warm and dry.     Capillary Refill: Capillary refill takes less than 2 seconds.  Neurological:     Mental Status: He is alert and oriented to person, place, and time. Mental status is at baseline.   Condition at discharge: fair  The results of significant diagnostics from this hospitalization (including imaging, microbiology, ancillary and laboratory) are listed below for reference.   Imaging Studies: ECHOCARDIOGRAM COMPLETE Result Date: 03/08/2024    ECHOCARDIOGRAM REPORT   Patient Name:   LOFTON LEON Date of Exam: 03/08/2024 Medical Rec #:  [id number]      Height:       62.0 in Accession #:    [id number]     Weight:       133.4 lb Date of Birth:  July 22, 1950       BSA:          1.609 m Patient Age:    73 years       BP:           148/82 mmHg Patient Gender: M              HR:           103 bpm. Exam Location:  Inpatient Procedure: 2D Echo, Cardiac Doppler and Color Doppler (Both Spectral and Color            Flow Doppler were utilized during procedure). Indications:    Elevated Troponins  History:        Patient has no prior history of Echocardiogram examinations.                 Risk Factors:Hypertension, Diabetes and Dyslipidemia.  Sonographer:    Damien Senior RDCS Referring Phys: [id number] EVA KATHEE PORE  Sonographer Comments: Apicals off axis due to thin body habitus IMPRESSIONS  1. Left ventricular ejection fraction, by estimation, is 55 to 60%. The left ventricle has normal function. The left ventricle has no regional wall motion abnormalities. Indeterminate diastolic filling due to E-A fusion.  2. Right ventricular systolic function is normal. The right ventricular size is normal. Tricuspid regurgitation signal is inadequate for assessing PA pressure.  3. Left atrial size was mildly dilated.  4. The mitral valve is normal in structure. Trivial mitral valve regurgitation.  5. The aortic valve is tricuspid.  Aortic valve regurgitation is not visualized. Aortic valve sclerosis/calcification is present, without any evidence of aortic stenosis.  6. Aortic dilatation noted. There is mild dilatation of the aortic root, measuring 37 mm.  7. The inferior vena cava is normal in size with greater than 50% respiratory variability, suggesting right atrial pressure of 3 mmHg. Comparison(s): No prior Echocardiogram. FINDINGS  Left Ventricle: Left ventricular ejection fraction, by estimation, is 55 to 60%. The left ventricle has normal function. The left ventricle has no regional wall motion abnormalities. The left ventricular internal cavity size was normal in size. There is  no left ventricular hypertrophy. Indeterminate diastolic filling due to E-A fusion. Right Ventricle: The right ventricular size is normal. No increase in right ventricular wall thickness. Right ventricular systolic function is normal. Tricuspid regurgitation signal is inadequate for assessing PA pressure. Left Atrium: Left atrial size was mildly dilated. Right Atrium: Right atrial size was normal in size. Pericardium: Trivial pericardial effusion is present. Mitral Valve: The mitral valve is normal in structure. Trivial mitral valve regurgitation. Tricuspid Valve: The tricuspid valve is normal in structure. Tricuspid valve regurgitation is trivial. Aortic Valve: The aortic valve is tricuspid. Aortic valve regurgitation is not visualized. Aortic valve sclerosis/calcification is present, without any evidence of aortic stenosis. Pulmonic Valve: The pulmonic valve was grossly normal. Pulmonic valve regurgitation is not visualized. Aorta: Aortic dilatation noted. There is mild dilatation of the aortic root, measuring 37 mm. Venous: The inferior vena cava is normal in size with greater than 50% respiratory variability, suggesting right atrial pressure of 3 mmHg. IAS/Shunts: No atrial level shunt detected by color flow Doppler.  LEFT VENTRICLE PLAX 2D LVIDd:          4.40 cm LVIDs:         3.10 cm LV PW:         0.80 cm LV IVS:        0.90 cm LVOT diam:     2.10 cm LV SV:         55 LV SV Index:   34 LVOT Area:     3.46 cm  RIGHT VENTRICLE RV S prime:     11.50 cm/s TAPSE (M-mode): 1.8 cm LEFT ATRIUM             Index        RIGHT ATRIUM           Index LA diam:        3.30 cm 2.05 cm/m   RA Area:     13.50 cm LA Vol (A2C):   41.4 ml 25.73 ml/m  RA Volume:   26.30 ml  16.34 ml/m LA Vol (A4C):   48.4 ml 30.07 ml/m LA Biplane Vol: 45.0 ml 27.96 ml/m  AORTIC VALVE LVOT Vmax:   72.40 cm/s LVOT Vmean:  62.400 cm/s LVOT VTI:    0.160 m  AORTA Ao Root diam: 3.70 cm Ao Asc diam:  3.00 cm  SHUNTS Systemic VTI:  0.16 m Systemic Diam: 2.10 cm Emeline Calender Electronically signed by Emeline Calender Signature Date/Time: 03/08/2024/5:21:47 PM    Final    DG Chest 1 View Result Date: 03/07/2024 EXAM: 1 VIEW XRAY OF THE CHEST 03/07/2024 08:42:00 PM COMPARISON: None available. CLINICAL HISTORY: cough. Multiple falls today and failure to thrive. FINDINGS: LUNGS AND PLEURA: Mild patchy opacity of the left lung base. No pulmonary edema. No pleural effusion. No pneumothorax. HEART AND MEDIASTINUM: Normal heart size and mediastinal contours. BONES AND SOFT TISSUES: No acute osseous abnormality. IMPRESSION: 1. Mild patchy left basilar airspace opacity. This may represent atelectasis or infection in the appropriate clinical setting. Electronically signed by: Andrea Gasman MD 03/07/2024 08:49 PM EDT RP Workstation: [id number]  CT ANGIO HEAD NECK W WO CM Result Date: 03/07/2024 EXAM: CTA HEAD AND NECK WITH AND WITHOUT 03/07/2024 07:44:54 PM TECHNIQUE: CTA of the head and neck was performed with and without the administration of 75 mL of iohexol (OMNIPAQUE) 350 MG/ML injection. Multiplanar 2D and/or 3D reformatted images are provided for review. Automated exposure control, iterative reconstruction, and/or weight based adjustment of the mA/kV was utilized to reduce the radiation dose to as low as  reasonably achievable. Stenosis of the internal carotid arteries measured using NASCET criteria. COMPARISON: None available CLINICAL HISTORY: Neuro deficit, acute, stroke suspected. Fall, neuro deficit, acute, stroke suspected. 75 ml omni 350. FINDINGS: CTA NECK: AORTIC ARCH AND ARCH VESSELS: No dissection or arterial injury. No significant stenosis of the brachiocephalic or subclavian arteries. CERVICAL CAROTID ARTERIES: Right carotid bifurcation: Atherosclerosis with 70% stenosis of the proximal ICA. Left carotid bifurcation: Atherosclerosis without greater than 50% stenosis. No dissection or arterial injury. CERVICAL VERTEBRAL ARTERIES: Right vertebral origin: Moderate stenosis. Left vertebral artery: No dissection, arterial injury, or significant stenosis. LUNGS AND MEDIASTINUM: Unremarkable. SOFT TISSUES: No acute abnormality. BONES: No acute abnormality. CTA HEAD: ANTERIOR CIRCULATION: Internal carotid arteries: Severe right and moderate left cavernous ICA stenosis. Anterior cerebral arteries: No significant stenosis. Middle cerebral arteries: No significant stenosis. No aneurysm. POSTERIOR CIRCULATION: Posterior cerebral arteries: No significant stenosis. Basilar artery: No significant stenosis. Vertebral arteries: No significant stenosis. No aneurysm. OTHER: No dural venous sinus thrombosis on this non-dedicated study. IMPRESSION: 1. No large vessel occlusion. 2. Approximately 70% stenosis of the proximal ICA. 3. Severe right and moderate left cavernous ICA stenosis. 4. Moderate right vertebral artery origin stenosis. Electronically signed by: Gilmore Molt MD 03/07/2024 08:46 PM EDT RP Workstation: [id number]    Microbiology: Results for orders placed or performed during the hospital encounter of 03/07/24  Resp panel by RT-PCR (RSV, Flu A&B, Covid) Peripheral     Status: None   Collection Time: 03/07/24  8:22 PM   Specimen: Peripheral; Nasal Swab  Result Value Ref Range Status   SARS Coronavirus  2 by RT PCR NEGATIVE NEGATIVE Final   Influenza A by PCR NEGATIVE NEGATIVE Final   Influenza B by PCR NEGATIVE NEGATIVE Final    Comment: (NOTE) The Xpert Xpress SARS-CoV-2/FLU/RSV plus assay is intended as an aid in the diagnosis of influenza from Nasopharyngeal swab specimens and should not be used as a sole basis for treatment. Nasal washings and aspirates are unacceptable for Xpert Xpress SARS-CoV-2/FLU/RSV testing.  Fact Sheet for Patients: BloggerCourse.com  Fact Sheet for Healthcare Providers: SeriousBroker.it  This test is not yet approved or cleared by the [place]  FDA and has been authorized for detection and/or diagnosis of SARS-CoV-2 by FDA under an Emergency Use Authorization (EUA). This EUA will remain in effect (meaning this test can be used) for the duration of the COVID-19 declaration under Section 564(b)(1) of the Act, 21 U.S.C. section 360bbb-3(b)(1), unless the authorization is terminated or revoked.     Resp Syncytial Virus by PCR NEGATIVE NEGATIVE Final    Comment: (NOTE) Fact Sheet for Patients: BloggerCourse.com  Fact Sheet for Healthcare Providers: SeriousBroker.it  This test is not yet approved or cleared by the [place]  FDA and has been authorized for detection and/or diagnosis of SARS-CoV-2 by FDA under an Emergency Use Authorization (EUA). This EUA will remain in effect (meaning this test can be used) for the duration of the COVID-19 declaration under Section 564(b)(1) of the Act, 21 U.S.C. section 360bbb-3(b)(1), unless the authorization is terminated or revoked.  Performed at [facility], [address], [place], [place] [address]   Blood culture (routine x 2)     Status: None   Collection Time: 03/07/24  8:22 PM   Specimen: BLOOD RIGHT ARM  Result Value Ref Range Status   Specimen Description BLOOD RIGHT ARM  Final    Special Requests   Final    BOTTLES DRAWN AEROBIC AND ANAEROBIC Blood Culture adequate volume   Culture   Final    NO GROWTH 5 DAYS Performed at [facility], [address] [place], [place] [address]    Report Status 03/12/2024 FINAL  Final  Blood culture (routine x 2)     Status: None   Collection Time: 03/07/24  8:31 PM   Specimen: BLOOD RIGHT ARM  Result Value Ref Range Status   Specimen Description BLOOD RIGHT ARM  Final   Special Requests   Final    BOTTLES DRAWN AEROBIC AND ANAEROBIC Blood Culture adequate volume   Culture   Final    NO GROWTH 5 DAYS Performed at [facility], [address] [place], [place] [address]    Report Status 03/12/2024 FINAL  Final    Labs: CBC: Recent Labs  Lab 03/07/24 1904 03/07/24 1958 03/07/24 2050 03/08/24 0559 03/09/24 0255  WBC 10.5  --   --  7.7 6.6  NEUTROABS  --   --   --  6.0 4.5  HGB 11.7* 10.5* 11.2* 11.2* 10.9*  HCT 33.7* 31.0* 33.0* 31.8* 30.8*  MCV 85.8  --   --  85.3 84.2  PLT 242  --   --  214 208   Basic Metabolic Panel: Recent Labs  Lab 03/07/24 1904 03/07/24 1958 03/07/24 2050 03/08/24 0559 03/09/24 0255 03/09/24 0535 03/10/24 0418  NA 137 137 138 135 139  --  136  K 3.9 4.0 3.7 3.3* 3.0*  --  3.5  CL 103 102 102 98 102  --  102  CO2 22  --   --  23 24  --  22  GLUCOSE 306* 309* 284* 279* 118*  --  215*  BUN 25* 28* 23 18 10   --  8  CREATININE 1.11 0.90 0.80 0.85 0.58*  --  0.65  CALCIUM  9.2  --   --  8.9 8.8*  --  8.5*  MG 1.5*  --   --  1.7  --  1.7  --   PHOS  --   --   --  2.1*  --  2.5  --    Liver Function Tests: Recent Labs  Lab 03/07/24 1904 03/08/24 0559  AST 28 38  ALT 20 20  ALKPHOS 72 42  BILITOT 1.1 0.9  PROT 6.1* 5.9*  ALBUMIN 3.6 3.2*   CBG: Recent Labs  Lab 03/12/24 1511 03/12/24 1616 03/12/24 2155 03/13/24 0628 03/13/24 0806  GLUCAP 237* 257* 287* 145* 198*    Discharge time spent:  40 minutes.  Signed: Nayara Taplin Al-Sultani, MD [facility] 03/13/2024

## 2024-03-13 NOTE — Progress Notes (Signed)
 Reviewed AVS, patient expressed understanding of medications, MD follow up reviewed.   Patient states all belongings brought to the hospital at time of admission are accounted for and packed to take home.  Patient is going to skilled facility and family member is transporting. Patient is being transported to Entrance A by nurse, to vehicle to transport SNF .

## 2024-03-13 NOTE — Care Management Important Message (Signed)
 Important Message  Patient Details  Name: Philip Holt MRN: [id number] Date of Birth: 1950-12-09   Important Message Given:  Yes - Medicare IM     Jon Cruel 03/13/2024, 4:45 PM

## 2024-03-13 NOTE — TOC Progression Note (Addendum)
 Transition of Care [facility]) - Progression Note    Patient Details  Name: Philip Holt MRN: [id number] Date of Birth: Dec 20, 1950  [facility] CM/SW Contact  Bridget Cordella Simmonds, LCSW Phone Number: 03/13/2024, 9:37 AM  Clinical Narrative:   CSW confirmed with [facility] that they can receive pt today.  MD informed.  0930: TC Anna/daughter: discussed transportation.  She will talk with brother in law about providing transport.   1200: DC summary complete.  CSW spoke with Therisa, she and her brother in law will come to transport pt.   Expected Discharge Plan: Skilled Nursing Facility Barriers to Discharge: Continued Medical Work up               Expected Discharge Plan and Services   Discharge Planning Services: CM Consult   Living arrangements for the past 2 months: Single Family Home                                       Social Drivers of Health (SDOH) Interventions SDOH Screenings   Food Insecurity: Patient Unable To Answer (03/08/2024)  Housing: Unknown (03/08/2024)  Transportation Needs: Patient Unable To Answer (03/08/2024)  Utilities: Patient Unable To Answer (03/08/2024)  Financial Resource Strain: Medium Risk (11/11/2023)   Received from [facility]  Physical Activity: Unknown (11/11/2023)   Received from [facility]  Social Connections: Patient Unable To Answer (03/08/2024)  Stress: Patient Declined (11/11/2023)   Received from [facility]  Tobacco Use: Medium Risk (03/07/2024)    Readmission Risk Interventions     No data to display

## 2024-03-13 NOTE — Plan of Care (Signed)

## 2024-03-13 NOTE — TOC Transition Note (Signed)
 Transition of Care [facility]) - Discharge Note   Patient Details  Name: Philip Holt MRN: [id number] Date of Birth: 01-29-1951  [facility]) CM/SW Contact:  Bridget Cordella Simmonds, LCSW Phone Number: 03/13/2024, 12:24 PM   Clinical Narrative:   Pt [facility] [place] [facility] [address], RN report to: [phone].   Pt daughter Therisa and her brother in law will transport pt.   Final next level of care: Skilled Nursing Facility Barriers to Discharge: Barriers Resolved   Patient Goals and CMS Choice     Choice offered to / list presented to : Patient, Adult Children      Discharge Placement              Patient [facility] at[facility] Patient to be transferred to facility by: daughter Therisa and brother in law Name of family member notified: daughter Therisa Patient and family notified of of transfer: 03/13/24  Discharge Plan and Services Additional resources added to the After Visit Summary for     Discharge Planning Services: CM Consult                                 Social Drivers of Health (SDOH) Interventions SDOH Screenings   Food Insecurity: Patient Unable To Answer (03/08/2024)  Housing: Unknown (03/08/2024)  Transportation Needs: Patient Unable To Answer (03/08/2024)  Utilities: Patient Unable To Answer (03/08/2024)  Financial Resource Strain: Medium Risk (11/11/2023)   Received from [facility]  Physical Activity: Unknown (11/11/2023)   Received from [facility]  Social Connections: Patient Unable To Answer (03/08/2024)  Stress: Patient Declined (11/11/2023)   Received from [facility]  Tobacco Use: Medium Risk (03/07/2024)     Readmission Risk Interventions     No data to display

## 2024-03-13 NOTE — Progress Notes (Signed)
 Report called to [facility] @ [facility].

## 2024-03-14 DIAGNOSIS — M6281 Muscle weakness (generalized): Secondary | ICD-10-CM | POA: Diagnosis not present

## 2024-03-14 DIAGNOSIS — R1312 Dysphagia, oropharyngeal phase: Secondary | ICD-10-CM | POA: Diagnosis not present

## 2024-03-14 DIAGNOSIS — R059 Cough, unspecified: Secondary | ICD-10-CM | POA: Diagnosis not present

## 2024-03-14 DIAGNOSIS — E43 Unspecified severe protein-calorie malnutrition: Secondary | ICD-10-CM | POA: Diagnosis not present

## 2024-03-14 DIAGNOSIS — E1140 Type 2 diabetes mellitus with diabetic neuropathy, unspecified: Secondary | ICD-10-CM | POA: Diagnosis not present

## 2024-03-14 DIAGNOSIS — E569 Vitamin deficiency, unspecified: Secondary | ICD-10-CM | POA: Diagnosis not present

## 2024-03-14 DIAGNOSIS — E785 Hyperlipidemia, unspecified: Secondary | ICD-10-CM | POA: Diagnosis not present

## 2024-03-14 DIAGNOSIS — I10 Essential (primary) hypertension: Secondary | ICD-10-CM | POA: Diagnosis not present

## 2024-03-16 DIAGNOSIS — E1140 Type 2 diabetes mellitus with diabetic neuropathy, unspecified: Secondary | ICD-10-CM | POA: Diagnosis not present

## 2024-03-16 DIAGNOSIS — M6281 Muscle weakness (generalized): Secondary | ICD-10-CM | POA: Diagnosis not present

## 2024-03-16 DIAGNOSIS — E43 Unspecified severe protein-calorie malnutrition: Secondary | ICD-10-CM | POA: Diagnosis not present

## 2024-03-16 DIAGNOSIS — E785 Hyperlipidemia, unspecified: Secondary | ICD-10-CM | POA: Diagnosis not present

## 2024-03-16 DIAGNOSIS — R1312 Dysphagia, oropharyngeal phase: Secondary | ICD-10-CM | POA: Diagnosis not present

## 2024-03-16 DIAGNOSIS — R059 Cough, unspecified: Secondary | ICD-10-CM | POA: Diagnosis not present

## 2024-03-16 DIAGNOSIS — I10 Essential (primary) hypertension: Secondary | ICD-10-CM | POA: Diagnosis not present

## 2024-03-16 DIAGNOSIS — E569 Vitamin deficiency, unspecified: Secondary | ICD-10-CM | POA: Diagnosis not present

## 2024-03-20 DIAGNOSIS — I10 Essential (primary) hypertension: Secondary | ICD-10-CM | POA: Diagnosis not present

## 2024-03-20 DIAGNOSIS — E1140 Type 2 diabetes mellitus with diabetic neuropathy, unspecified: Secondary | ICD-10-CM | POA: Diagnosis not present

## 2024-03-22 DIAGNOSIS — F3289 Other specified depressive episodes: Secondary | ICD-10-CM | POA: Diagnosis not present

## 2024-03-23 DIAGNOSIS — R102 Pelvic and perineal pain unspecified side: Secondary | ICD-10-CM | POA: Diagnosis not present

## 2024-03-23 DIAGNOSIS — S30812A Abrasion of penis, initial encounter: Secondary | ICD-10-CM | POA: Diagnosis not present

## 2024-03-23 DIAGNOSIS — N4889 Other specified disorders of penis: Secondary | ICD-10-CM | POA: Diagnosis not present

## 2024-03-23 DIAGNOSIS — N4830 Priapism, unspecified: Secondary | ICD-10-CM | POA: Diagnosis not present

## 2024-03-23 DIAGNOSIS — Q541 Hypospadias, penile: Secondary | ICD-10-CM | POA: Diagnosis not present

## 2024-03-23 NOTE — ED Provider Notes (Signed)
 Emergency Department Provider Note     Provider at bedside: 12:03 PM  History obtained from the: Patient  History   Chief Complaint  Patient presents with  . Wound Check     Patient presents with complaints of penile pain.  The patient states that he has been having penile pain for the last 2 weeks.  He states that he is at a nursing home and when they change his diaper the nurses at the nursing or just pulling the diaper off and not doing it slowly and it causes pain.  He also reports a history of hypospadias and having a penis pump.  He states he has not messed with the penis pump in over a year.  He denies any other complaints at this time.      No LMP for male patient.   Past Medical History Medical History[1]  Past Surgical History Surgical History[2]   Medications Prior to Admission medications  Not on File     Allergies Allergies[3]   Family History Family History[4]   Social History Social History[5]   Review of Systems  Review of Systems  Constitutional:  Negative for chills and fever.  Respiratory:  Negative for shortness of breath.   Cardiovascular:  Negative for chest pain.  Gastrointestinal:  Negative for abdominal pain, diarrhea, nausea and vomiting.  Genitourinary:  Positive for penile pain.     Physical Exam   Vitals:   03/23/24 1139 03/23/24 1200 03/23/24 1230  BP: 153/83 146/80 146/80  BP Location: Left arm  Left arm  Patient Position: Sitting  Sitting  Pulse: 91 92 88  Resp: (!) 21 18 19   Temp: 98.1 F (36.7 C)    TempSrc: Oral    SpO2: 99% 99% 100%  Weight: 56.7 kg (125 lb)    Height: 157.5 cm (5' 2)      Physical Exam Vitals and nursing note reviewed.  Constitutional:      General: He is not in acute distress.    Appearance: Normal appearance. He is not ill-appearing.  HENT:     Head: Normocephalic and atraumatic.     Right Ear: External ear normal.     Left Ear: External ear normal.     Mouth/Throat:      Mouth: Mucous membranes are moist.     Pharynx: Oropharynx is clear.  Cardiovascular:     Rate and Rhythm: Normal rate and regular rhythm.     Pulses: Normal pulses.     Heart sounds: Normal heart sounds.  Pulmonary:     Effort: Pulmonary effort is normal.     Breath sounds: Normal breath sounds.  Abdominal:     Palpations: Abdomen is soft.     Tenderness: There is no abdominal tenderness.  Genitourinary:    Comments: Patient has hypospadias with urethral opening just behind the glans penis on the ventral surface of the penis.  There is no irritation or erythema here.  There is a small scab just to the left of midline on the tip of the penis.  There is some material stuck to the scab which likely is from the diaper.  Otherwise, the penis is erect with the penis pump but otherwise looks normal. Skin:    General: Skin is warm and dry.     Capillary Refill: Capillary refill takes less than 2 seconds.  Neurological:     General: No focal deficit present.     Mental Status: He is alert and oriented to person, place, and  time.  Psychiatric:        Mood and Affect: Mood normal.        Behavior: Behavior normal.        Thought Content: Thought content normal.        Judgment: Judgment normal.      Previous Chart Review   Patient's discharge summary from 03/13/2024 when the patient was seen for acute kidney injury and diabetes.  Differential Diagnosis   STD, physical abrasion, other irritation  Cardiac Monitor Interpretation   Sinus at 91, 99% sats.  EKG Interpretation and HEAR Score   None      Initial Radiology Interpretation & Consideration   None  Radiology reads of all radiologic studies have been reviewed.  Lab Interpretation   None   Interventions   Wound coated with bacitracin  Procedure Note  Procedures   Consults   None  MDM   ED Course as of 03/23/24 1238  Thu Mar 23, 2024  1202 Patient presented for complaints of penile pain.  He has a  small scab just to the left of midline on the tip of his penis.  There is material stuck to it from a diaper.  It is likely that the scab is being pulled off when the diaper is changed at the nursing home.  Patient also has hypospadias which is longstanding.  He also has a penis pump which is erect and has been for over a year.  We are going to put bacitracin on the scab so it will not stick to the diaper and advised the nursing home to the same. [JW]    ED Course User Index [JW] Dorn Arloa Hoover, MD     Clinical Complexity  Patient's presentation is most consistent with acute presentation with potential threat to life or bodily function.  Provider time spent in patient care today, inclusive of but not limited to clinical reassessment, review of diagnostic studies, and discharge preparation, was greater than 30 minutes.   ED Clinical Impression   1. Penile hypospadias Acute  2. Abrasion of penis, initial encounter Acute    ED Disposition   Patient to be discharged to home.  New Prescriptions   No medications on file    FOLLOW UP Charlie LELON Auer, MD [address] [place] [address] [phone]  Schedule an appointment as soon as possible for a visit in 3 days For follow up  [facility] -  EMERGENCY DEPARTMENT [address] [place]  [address] [phone] Go to  As needed    _______________________________________________________________________         [1] No past medical history on file. [2] No past surgical history on file. [3] No Known Allergies [4] No family history on file. [5]

## 2024-03-24 DIAGNOSIS — K219 Gastro-esophageal reflux disease without esophagitis: Secondary | ICD-10-CM | POA: Diagnosis not present

## 2024-03-24 DIAGNOSIS — E43 Unspecified severe protein-calorie malnutrition: Secondary | ICD-10-CM | POA: Diagnosis not present

## 2024-03-24 DIAGNOSIS — R1312 Dysphagia, oropharyngeal phase: Secondary | ICD-10-CM | POA: Diagnosis not present

## 2024-03-24 DIAGNOSIS — K5900 Constipation, unspecified: Secondary | ICD-10-CM | POA: Diagnosis not present

## 2024-03-27 DIAGNOSIS — E1140 Type 2 diabetes mellitus with diabetic neuropathy, unspecified: Secondary | ICD-10-CM | POA: Diagnosis not present

## 2024-03-27 DIAGNOSIS — M6281 Muscle weakness (generalized): Secondary | ICD-10-CM | POA: Diagnosis not present

## 2024-03-27 DIAGNOSIS — N5201 Erectile dysfunction due to arterial insufficiency: Secondary | ICD-10-CM | POA: Diagnosis not present

## 2024-03-30 DIAGNOSIS — N4830 Priapism, unspecified: Secondary | ICD-10-CM | POA: Diagnosis not present

## 2024-04-03 DIAGNOSIS — R600 Localized edema: Secondary | ICD-10-CM | POA: Diagnosis not present

## 2024-04-03 DIAGNOSIS — I10 Essential (primary) hypertension: Secondary | ICD-10-CM | POA: Diagnosis not present

## 2024-04-03 DIAGNOSIS — K219 Gastro-esophageal reflux disease without esophagitis: Secondary | ICD-10-CM | POA: Diagnosis not present

## 2024-04-06 DIAGNOSIS — R197 Diarrhea, unspecified: Secondary | ICD-10-CM | POA: Diagnosis not present

## 2024-04-10 DIAGNOSIS — I10 Essential (primary) hypertension: Secondary | ICD-10-CM | POA: Diagnosis not present

## 2024-04-10 DIAGNOSIS — E43 Unspecified severe protein-calorie malnutrition: Secondary | ICD-10-CM | POA: Diagnosis not present

## 2024-04-10 DIAGNOSIS — R600 Localized edema: Secondary | ICD-10-CM | POA: Diagnosis not present

## 2024-04-12 DIAGNOSIS — K219 Gastro-esophageal reflux disease without esophagitis: Secondary | ICD-10-CM | POA: Diagnosis not present

## 2024-04-12 DIAGNOSIS — R600 Localized edema: Secondary | ICD-10-CM | POA: Diagnosis not present

## 2024-04-12 DIAGNOSIS — E785 Hyperlipidemia, unspecified: Secondary | ICD-10-CM | POA: Diagnosis not present

## 2024-04-12 DIAGNOSIS — E1140 Type 2 diabetes mellitus with diabetic neuropathy, unspecified: Secondary | ICD-10-CM | POA: Diagnosis not present

## 2024-04-13 DIAGNOSIS — L0390 Cellulitis, unspecified: Secondary | ICD-10-CM | POA: Diagnosis not present

## 2024-04-17 DIAGNOSIS — I10 Essential (primary) hypertension: Secondary | ICD-10-CM | POA: Diagnosis not present

## 2024-04-17 DIAGNOSIS — R600 Localized edema: Secondary | ICD-10-CM | POA: Diagnosis not present

## 2024-04-17 DIAGNOSIS — L0390 Cellulitis, unspecified: Secondary | ICD-10-CM | POA: Diagnosis not present

## 2024-04-21 DIAGNOSIS — E1140 Type 2 diabetes mellitus with diabetic neuropathy, unspecified: Secondary | ICD-10-CM | POA: Diagnosis not present

## 2024-04-25 DIAGNOSIS — I10 Essential (primary) hypertension: Secondary | ICD-10-CM | POA: Diagnosis not present

## 2024-04-25 DIAGNOSIS — E43 Unspecified severe protein-calorie malnutrition: Secondary | ICD-10-CM | POA: Diagnosis not present

## 2024-05-08 DIAGNOSIS — N5201 Erectile dysfunction due to arterial insufficiency: Secondary | ICD-10-CM | POA: Diagnosis not present

## 2024-05-10 DIAGNOSIS — I10 Essential (primary) hypertension: Secondary | ICD-10-CM | POA: Diagnosis not present

## 2024-05-10 DIAGNOSIS — E1140 Type 2 diabetes mellitus with diabetic neuropathy, unspecified: Secondary | ICD-10-CM | POA: Diagnosis not present

## 2024-05-10 DIAGNOSIS — R1312 Dysphagia, oropharyngeal phase: Secondary | ICD-10-CM | POA: Diagnosis not present

## 2024-05-10 DIAGNOSIS — M6281 Muscle weakness (generalized): Secondary | ICD-10-CM | POA: Diagnosis not present

## 2024-05-10 DIAGNOSIS — E43 Unspecified severe protein-calorie malnutrition: Secondary | ICD-10-CM | POA: Diagnosis not present

## 2024-05-10 DIAGNOSIS — R600 Localized edema: Secondary | ICD-10-CM | POA: Diagnosis not present

## 2024-05-10 DIAGNOSIS — R41841 Cognitive communication deficit: Secondary | ICD-10-CM | POA: Diagnosis not present

## 2024-09-27 ENCOUNTER — Ambulatory Visit: Admitting: Neurology
